# Patient Record
Sex: Female | Born: 2000 | Race: Black or African American | Hispanic: No | Marital: Single | Smoking: Never smoker
Health system: Southern US, Community
[De-identification: ages and names within clinical notes are randomized; demographics above are authoritative.]

## PROBLEM LIST (undated history)

## (undated) ENCOUNTER — Inpatient Hospital Stay (HOSPITAL_COMMUNITY): Payer: Self-pay

## (undated) DIAGNOSIS — E559 Vitamin D deficiency, unspecified: Secondary | ICD-10-CM

## (undated) DIAGNOSIS — Z789 Other specified health status: Secondary | ICD-10-CM

## (undated) DIAGNOSIS — G4700 Insomnia, unspecified: Secondary | ICD-10-CM

## (undated) DIAGNOSIS — K219 Gastro-esophageal reflux disease without esophagitis: Secondary | ICD-10-CM

## (undated) DIAGNOSIS — E785 Hyperlipidemia, unspecified: Secondary | ICD-10-CM

## (undated) DIAGNOSIS — F419 Anxiety disorder, unspecified: Secondary | ICD-10-CM

## (undated) DIAGNOSIS — F32A Depression, unspecified: Secondary | ICD-10-CM

## (undated) DIAGNOSIS — R7303 Prediabetes: Secondary | ICD-10-CM

## (undated) DIAGNOSIS — E669 Obesity, unspecified: Secondary | ICD-10-CM

## (undated) HISTORY — DX: Obesity, unspecified: E66.9

## (undated) HISTORY — DX: Vitamin D deficiency, unspecified: E55.9

## (undated) HISTORY — DX: Insomnia, unspecified: G47.00

## (undated) HISTORY — DX: Prediabetes: R73.03

## (undated) HISTORY — DX: Anxiety disorder, unspecified: F41.9

## (undated) HISTORY — PX: NO PAST SURGERIES: SHX2092

## (undated) HISTORY — DX: Hyperlipidemia, unspecified: E78.5

## (undated) HISTORY — DX: Other specified health status: Z78.9

## (undated) HISTORY — DX: Gastro-esophageal reflux disease without esophagitis: K21.9

## (undated) HISTORY — DX: Depression, unspecified: F32.A

---
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
## 2019-05-06 ENCOUNTER — Encounter (HOSPITAL_COMMUNITY): Payer: Self-pay | Admitting: *Deleted

## 2019-05-06 ENCOUNTER — Inpatient Hospital Stay (HOSPITAL_COMMUNITY)
Admission: AD | Admit: 2019-05-06 | Discharge: 2019-05-06 | Disposition: A | Discharge status: Home / Self Care | Payer: Medicaid Other | Attending: Obstetrics and Gynecology | Admitting: Obstetrics and Gynecology

## 2019-05-06 DIAGNOSIS — O209 Hemorrhage in early pregnancy, unspecified: Secondary | ICD-10-CM | POA: Insufficient documentation

## 2019-05-06 DIAGNOSIS — R109 Unspecified abdominal pain: Secondary | ICD-10-CM | POA: Diagnosis not present

## 2019-05-06 DIAGNOSIS — O26892 Other specified pregnancy related conditions, second trimester: Secondary | ICD-10-CM | POA: Diagnosis present

## 2019-05-06 DIAGNOSIS — Z3A15 15 weeks gestation of pregnancy: Secondary | ICD-10-CM | POA: Diagnosis not present

## 2019-05-06 LAB — URINALYSIS, ROUTINE W REFLEX MICROSCOPIC
Bilirubin Urine: NEGATIVE
Glucose, UA: NEGATIVE mg/dL
Ketones, ur: 20 mg/dL — AB
Nitrite: NEGATIVE
Protein, ur: NEGATIVE mg/dL
Specific Gravity, Urine: 1.031 — ABNORMAL HIGH (ref 1.005–1.030)
pH: 5 (ref 5.0–8.0)

## 2019-05-06 LAB — WET PREP, GENITAL
Clue Cells Wet Prep HPF POC: NONE SEEN
Sperm: NONE SEEN
Trich, Wet Prep: NONE SEEN
Yeast Wet Prep HPF POC: NONE SEEN

## 2019-05-06 LAB — ABO/RH: ABO/RH(D): A POS

## 2019-05-06 NOTE — MAU Note (Signed)
Patient presents via EMS for abdominal pain that started last night after a small dog (10-15lbs) jumped on her abdomen.  Rates pain 5/10 with constant pain in her lower abdomen.  Had 1 episode of vaginal spotting noted today and decided to get checked out.

## 2019-05-06 NOTE — Discharge Instructions (Signed)
[facility]   [facility]       Phone: [phone]  [facility] Phone: [phone]  [facility] [place]  Phone: [phone]  [facility] [place] [facility]  Phone: [phone]  [facility]  Phone: [phone]  [facility]       Phone: [phone]  [facility]    Phone: [phone]   [facility]    Phone: [phone]  [facility]    Phone: [phone]  [facility]    Phone: [phone]   [facility]  Phone: [phone]  [facility]    Phone: [phone]  [facility]   Phone: [phone]  [facility]    Phone: [phone]   Abdominal Pain During Pregnancy  Abdominal pain is common during pregnancy, and has many possible causes. Some causes are more serious than others, and sometimes the cause is not known. Abdominal pain can be a sign that labor is starting. It can also be caused by normal growth and stretching of muscles and ligaments during pregnancy. Always tell your health care provider if you have any abdominal pain. Follow these instructions at home:  Do not have sex or put anything in your vagina until your pain goes away completely.  Get plenty of rest until your pain improves.  Drink enough fluid to keep your urine pale yellow.  Take over-the-counter and prescription medicines only as told by your health care provider.  Keep all follow-up visits as told by your health care provider. This is important. Contact a health care provider if:  Your pain continues or gets worse after resting.  You have lower abdominal pain that: ? Comes and goes at regular intervals. ? Spreads to your  back. ? Is similar to menstrual cramps.  You have pain or burning when you urinate. Get help right away if:  You have a fever or chills.  You have vaginal bleeding.  You are leaking fluid from your vagina.  You are passing tissue from your vagina.  You have vomiting or diarrhea that lasts for more than 24 hours.  Your baby is moving less than usual.  You feel very weak or faint.  You have shortness of breath.  You develop severe pain in your upper abdomen. Summary  Abdominal pain is common during pregnancy, and has many possible causes.  If you experience abdominal pain during pregnancy, tell your health care provider right away.  Follow your health care provider's home care instructions and keep all follow-up visits as directed. This information is not intended to replace advice given to you by your health care provider. Make sure you discuss any questions you have with your health care provider. Document Released: 06/12/2005 Document Revised: 09/30/2018 Document Reviewed: 09/14/2016 Elsevier Patient Education  2020 Elsevier Inc.  

## 2019-05-06 NOTE — MAU Provider Note (Signed)
Chief Complaint: Abdominal Pain and Vaginal Bleeding   First Provider Initiated Contact with Patient 05/06/19 1207     SUBJECTIVE HPI: Colon Flatterynchanted C Heidi [name] is a 18 y.o. [name] at 2241w1d who presents to Maternity Admissions via EMS reporting abdominal pain & vaginal spotting. Symptoms started this morning. Patient worried because her 15 lb dog jumped on her abdomen last night.  Reports intermittent lower abdomen/suprapubic cramping. Dark red/brown spotting on toilet paper x 1 episode this morning. Denies dysuria, n/v/d, or vaginal discharge. No recent intercourse. Has not started prenatal care yet. [facility].   Location: abdomen Quality: cramping Severity: 5/10 on pain scale Duration: since this morning Timing: intermittent Modifying factors: none Associated signs and symptoms: vaginal spotting  History reviewed. No pertinent past medical history. OB History  Gravida Para Term Preterm AB Living  1            SAB TAB Ectopic Multiple Live Births               # Outcome Date GA Lbr Len/2nd Weight Sex Delivery Anes PTL Lv  1 Current            History reviewed. No pertinent surgical history. Social History   Socioeconomic History  . Marital status: Single    Spouse name: Not on file  . Number of children: Not on file  . Years of education: Not on file  . Highest education level: Not on file  Occupational History  . Not on file  Social Needs  . Financial resource strain: Not on file  . Food insecurity    Worry: Not on file    Inability: Not on file  . Transportation needs    Medical: Not on file    Non-medical: Not on file  Tobacco Use  . Smoking status: Never Smoker  Substance and Sexual Activity  . Alcohol use: No  . Drug use: No  . Sexual activity: Not Currently  Lifestyle  . Physical activity    Days per week: Not on file    Minutes per session: Not on file  . Stress: Not on file  Relationships  . Social  Musicianconnections    Talks on phone: Not on file    Gets together: Not on file    Attends religious service: Not on file    Active member of club or organization: Not on file    Attends meetings of clubs or organizations: Not on file    Relationship status: Not on file  . Intimate partner violence    Fear of current or ex partner: Not on file    Emotionally abused: Not on file    Physically abused: Not on file    Forced sexual activity: Not on file  Other Topics Concern  . Not on file  Social History Narrative  . Not on file   History reviewed. No pertinent family history. No current facility-administered medications on file prior to encounter.    No current outpatient medications on file prior to encounter.   Allergies  Allergen Reactions  . Strawberry Extract     I have reviewed patient's Past Medical Hx, Surgical Hx, Family Hx, Social Hx, medications and allergies.   Review of Systems  Constitutional: Negative.   Gastrointestinal: Positive for abdominal pain. Negative for diarrhea, nausea and vomiting.  Genitourinary: Positive for vaginal bleeding. Negative for dysuria and vaginal discharge.    OBJECTIVE Patient Vitals for the past 24 hrs:  BP Temp Temp src Pulse Resp SpO2  05/06/19 1401 (!) 107/57 98.8 F (37.1 C) Oral 100 17 100 %  05/06/19 1145 124/74 98.8 F (37.1 C) Oral 97 16 100 %   Constitutional: Well-developed, well-nourished female in no acute distress.  Cardiovascular: normal rate & rhythm, no murmur Respiratory: normal rate and effort. Lung sounds clear throughout GI: Abd soft, non-tender, Pos BS x 4. No guarding or rebound tenderness MS: Extremities nontender, no edema, normal ROM Neurologic: Alert and oriented x 4.  GU:     SPECULUM EXAM: NEFG, physiologic discharge, no blood noted, cervix clean  BIMANUAL: No CMT. cervix closed; uterus enlarged 15 wks size, no adnexal tenderness or masses.    LAB RESULTS Results for orders placed or performed during  the hospital encounter of 05/06/19 (from the past 24 hour(s))  Urinalysis, Routine w reflex microscopic     Status: Abnormal   Collection Time: 05/06/19 12:27 PM  Result Value Ref Range   Color, Urine YELLOW YELLOW   APPearance HAZY (A) CLEAR   Specific Gravity, Urine 1.031 (H) 1.005 - 1.030   pH 5.0 5.0 - 8.0   Glucose, UA NEGATIVE NEGATIVE mg/dL   Hgb urine dipstick MODERATE (A) NEGATIVE   Bilirubin Urine NEGATIVE NEGATIVE   Ketones, ur 20 (A) NEGATIVE mg/dL   Protein, ur NEGATIVE NEGATIVE mg/dL   Nitrite NEGATIVE NEGATIVE   Leukocytes,Ua SMALL (A) NEGATIVE   RBC / HPF 6-10 0 - 5 RBC/hpf   WBC, UA 0-5 0 - 5 WBC/hpf   Bacteria, UA FEW (A) NONE SEEN   Squamous Epithelial / LPF 0-5 0 - 5   Mucus PRESENT   Wet prep, genital     Status: Abnormal   Collection Time: 05/06/19 12:27 PM   Specimen: Cervix  Result Value Ref Range   Yeast Wet Prep HPF POC NONE SEEN NONE SEEN   Trich, Wet Prep NONE SEEN NONE SEEN   Clue Cells Wet Prep HPF POC NONE SEEN NONE SEEN   WBC, Wet Prep HPF POC MODERATE (A) NONE SEEN   Sperm NONE SEEN   ABO/Rh     Status: None   Collection Time: 05/06/19 12:28 PM  Result Value Ref Range   ABO/RH(D) A POS    No rh immune globuloin      NOT A RH IMMUNE GLOBULIN CANDIDATE, PT RH POSITIVE Performed at [facility], [address] [place], [place] [address]     IMAGING No results found.  MAU COURSE Orders Placed This Encounter  Procedures  . Wet prep, genital  . Culture, OB Urine  . Urinalysis, Routine w reflex microscopic  . ABO/Rh  . Discharge patient   No orders of the defined types were placed in this encounter.   MDM FHT present via doppler  ABO/RH collected RH positive  No blood on exam & cervix closed  Pt informed that the ultrasound is considered a limited OB ultrasound and is not intended to be a complete ultrasound exam.  Patient also informed that the ultrasound is not being completed with the intent of assessing for fetal or  placental anomalies or any pelvic abnormalities.  Explained that the purpose of today's ultrasound is to assess for  pt comfort.  Patient acknowledges the purpose of the exam and the limitations of the study.  Live IUP, ~[redacted]w[redacted]d by BPD & femur length. FHR 150s bpm   ASSESSMENT 1. Abdominal pain during pregnancy in second trimester   2. [redacted] weeks gestation of pregnancy  PLAN Discharge home in stable condition. SAB precautions GC/CT pending Urine culture pending Start prenatal care asap  Follow-up Information    [facility] Follow up.   Specialty: Obstetrics and Gynecology Why: return for worsening symptoms Contact information: [address] [phone] Wilhemina Bon[place] [address] [phone]         Allergies as of 05/06/2019      Reactions   Strawberry Extract       Medication List    You have not been prescribed any medications.      Judeth Horn, NP 05/06/2019  3:45 PM

## 2019-05-06 NOTE — MAU Note (Signed)
Discharge instructions reviewed, provider phone numbers given. Patient with no questions at this time.

## 2019-05-07 LAB — GC/CHLAMYDIA PROBE AMP (~~LOC~~) NOT AT ARMC
Chlamydia: NEGATIVE
Comment: NEGATIVE
Comment: NORMAL
Neisseria Gonorrhea: NEGATIVE

## 2019-05-08 LAB — CULTURE, OB URINE: Culture: >=100000 — AB

## 2019-05-09 ENCOUNTER — Telehealth: Payer: Self-pay | Admitting: Advanced Practice Midwife

## 2019-05-09 MED ORDER — CEPHALEXIN 500 MG PO CAPS: 500.0000 mg | ORAL_CAPSULE | Freq: Four times a day (QID) | ORAL | 0 refills | Status: DC

## 2019-05-09 NOTE — Telephone Encounter (Signed)
Called pt to notify her of UTI on UA 05/06/19 in MAU.  Keflex 500 mg QID x 7 days.  Pt states understanding. Pt to begin prenatal care as planned.

## 2019-06-30 ENCOUNTER — Other Ambulatory Visit: Payer: Self-pay

## 2019-06-30 ENCOUNTER — Ambulatory Visit (INDEPENDENT_AMBULATORY_CARE_PROVIDER_SITE_OTHER): Payer: Medicaid Other | Admitting: *Deleted

## 2019-06-30 DIAGNOSIS — Z3490 Encounter for supervision of normal pregnancy, unspecified, unspecified trimester: Secondary | ICD-10-CM

## 2019-06-30 DIAGNOSIS — E669 Obesity, unspecified: Secondary | ICD-10-CM | POA: Insufficient documentation

## 2019-06-30 MED ORDER — BLOOD PRESSURE KIT DEVI
1.0000 | 0 refills | Status: DC | PRN
Start: 2019-06-30 — End: 2022-06-23

## 2019-06-30 NOTE — Progress Notes (Signed)
I connected with  Colon Flattery on 06/30/19 at  2:30 PM EST by telephone and verified that I am speaking with the correct person using two identifiers.   I discussed the limitations, risks, security and privacy concerns of performing an evaluation and management service by telephone and the availability of in person appointments. I also discussed with the patient that there may be a patient responsible charge related to this service. The patient expressed understanding and agreed to proceed. Explained I am completing her New OB Intake today. We discussed Her EDD and that it is based on  US done at [facility]. She will bring her letter from them to her new ob visit tomorrow . I reviewed her allergies, meds, OB History, Medical /Surgical history, and appropriate screenings. I explained I will send her the [employer] app- app sent to her while on phone.  I explained we will send a blood pressure cuff to [facility] that will [facility] that prescription and they  will call her to verify her information. I asked her to bring the blood pressure cuff with her to her next  ob appointment so we can show her how to use it. Explained  then we will have her take her blood pressure weekly and enter into the app. Explained she will have some visits in office and some virtually. I sent her MyChart text and she signed up for MyChart and downloaded the MyChart  app. I reviewed her new ob  appointment date/ time with her , our location and to wear mask, no visitors.  I explained she will have a pelvic exam, ob bloodwork, hemoglobin a1C, cbg , genetic testing if desired,- she is not sure if she wants a panorama.. I scheduled an [place] for first available appointment and gave her the appointment. She voices understanding.   Matai Carpenito,RN 06/30/2019  2:27 PM

## 2019-06-30 NOTE — Patient Instructions (Signed)

## 2019-07-01 ENCOUNTER — Encounter: Payer: Self-pay | Admitting: Advanced Practice Midwife

## 2019-07-01 ENCOUNTER — Other Ambulatory Visit (HOSPITAL_COMMUNITY)
Admission: RE | Admit: 2019-07-01 | Discharge: 2019-07-01 | Disposition: A | Discharge status: Home / Self Care | Payer: Medicaid Other | Source: Ambulatory Visit | Attending: Advanced Practice Midwife | Admitting: Advanced Practice Midwife

## 2019-07-01 ENCOUNTER — Ambulatory Visit (INDEPENDENT_AMBULATORY_CARE_PROVIDER_SITE_OTHER): Payer: Medicaid Other | Admitting: Advanced Practice Midwife

## 2019-07-01 VITALS — BP 104/72 | HR 99 | Wt 172.7 lb

## 2019-07-01 DIAGNOSIS — Z3A23 23 weeks gestation of pregnancy: Secondary | ICD-10-CM

## 2019-07-01 DIAGNOSIS — Z3492 Encounter for supervision of normal pregnancy, unspecified, second trimester: Secondary | ICD-10-CM

## 2019-07-01 DIAGNOSIS — Z3490 Encounter for supervision of normal pregnancy, unspecified, unspecified trimester: Secondary | ICD-10-CM | POA: Diagnosis present

## 2019-07-01 NOTE — Progress Notes (Signed)
  Subjective:   Heidi [name] is a 19 y.o. [name] at 24w1dby early ultrasound being seen today for her first obstetrical visit.  Her obstetrical history is significant for NA. Patient does intend to breast feed. Pregnancy history fully reviewed.  [place] [facility] [place] and EDD of 10/27/19. Records reviewed, and will scan those in.   Patient reports no complaints.  HISTORY: OB History  Gravida Para Term Preterm AB Living  1 0 0 0 0 0  SAB TAB Ectopic Multiple Live Births  0 0 0 0 0    # Outcome Date GA Lbr Len/2nd Weight Sex Delivery Anes PTL Lv  1 Current             Last pap smear was done NA, age  and was NA, age   Past Medical History:  Diagnosis Date  . Medical history non-contributory    History reviewed. No pertinent surgical history. Family History  Problem Relation Age of Onset  . Diabetes Mother   . Hypertension Mother    Social History   Tobacco Use  . Smoking status: Never Smoker  . Smokeless tobacco: Never Used  Substance Use Topics  . Alcohol use: No  . Drug use: No   Allergies  Allergen Reactions  . Strawberry Extract    Current Outpatient Medications on File Prior to Visit  Medication Sig Dispense Refill  . Prenatal MV-Min-FA-Omega-3 (PRENATAL GUMMIES/DHA & FA PO) Take 2 tablets by mouth daily.    . Blood Pressure Monitoring (BLOOD PRESSURE KIT) DEVI 1 Device by Does not apply route as needed. (Patient not taking: Reported on 07/01/2019) 1 each 0   No current facility-administered medications on file prior to visit.    Review of Systems Pertinent items noted in HPI and remainder of comprehensive ROS otherwise negative.  Exam   Vitals:   07/01/19 0849  BP: 104/72  Pulse: 99  Weight: 172 lb 11.2 oz (78.3 kg)   Fetal Heart Rate (bpm): 136  Physical Exam  Constitutional: She is oriented to person, place, and time and well-developed, well-nourished, and in no distress. No distress.  Cardiovascular: Normal rate.   Pulmonary/Chest: Effort normal.  Abdominal: Soft. There is no abdominal tenderness.  Neurological: She is alert and oriented to person, place, and time.  Skin: Skin is warm and dry.  Psychiatric: Affect normal.  Nursing note and vitals reviewed.  23 cm fundal height   Assessment:   Pregnancy: [name] Patient Active Problem List   Diagnosis Date Noted  . Supervision of low-risk pregnancy 06/30/2019  . Obesity (BMI 30-39.9) 06/30/2019     Plan:  1. Encounter for supervision of low-risk pregnancy, antepartum - routine care   Initial labs drawn. Continue prenatal vitamins. Genetic Screening discussed, AFP and NIPS: requested. Ultrasound discussed; fetal anatomic survey: ordered. Problem list reviewed and updated. The nature of [place] and other [facility] Practice Providers was explained to patient; also emphasized that residents, students are part of our team. Routine obstetric precautions reviewed. 50% of 45 min visit spent in counseling and coordination of care. No follow-ups on file.   HMarcille BuffyDNP, CNM  07/01/19  9:13 AM

## 2019-07-01 NOTE — Progress Notes (Signed)
Pt reports occasional back pain and pelvic pressure.

## 2019-07-02 ENCOUNTER — Encounter: Payer: Self-pay | Admitting: *Deleted

## 2019-07-02 LAB — OBSTETRIC PANEL, INCLUDING HIV
Antibody Screen: NEGATIVE
Basophils Absolute: 0 10*3/uL (ref 0.0–0.2)
Basos: 0 %
EOS (ABSOLUTE): 0 10*3/uL (ref 0.0–0.4)
Eos: 0 %
HIV Screen 4th Generation wRfx: NONREACTIVE
Hematocrit: 29.6 % — ABNORMAL LOW (ref 34.0–46.6)
Hemoglobin: 9.8 g/dL — ABNORMAL LOW (ref 11.1–15.9)
Hepatitis B Surface Ag: NEGATIVE
Immature Grans (Abs): 0.1 10*3/uL (ref 0.0–0.1)
Immature Granulocytes: 1 %
Lymphocytes Absolute: 0.9 10*3/uL (ref 0.7–3.1)
Lymphs: 19 %
MCH: 29.2 pg (ref 26.6–33.0)
MCHC: 33.1 g/dL (ref 31.5–35.7)
MCV: 88 fL (ref 79–97)
Monocytes Absolute: 0.9 10*3/uL (ref 0.1–0.9)
Monocytes: 19 %
Neutrophils Absolute: 3 10*3/uL (ref 1.4–7.0)
Neutrophils: 61 %
Platelets: 99 10*3/uL — CL (ref 150–450)
RBC: 3.36 x10E6/uL — ABNORMAL LOW (ref 3.77–5.28)
RDW: 13.2 % (ref 11.7–15.4)
RPR Ser Ql: NONREACTIVE
Rh Factor: POSITIVE
Rubella Antibodies, IGG: 3.09 index (ref >0.99)
WBC: 4.9 10*3/uL (ref 3.4–10.8)

## 2019-07-02 LAB — HEMOGLOBIN A1C
Est. average glucose Bld gHb Est-mCnc: 91 mg/dL
Hgb A1c MFr Bld: 4.8 % (ref 4.8–5.6)

## 2019-07-02 LAB — GC/CHLAMYDIA PROBE AMP (~~LOC~~) NOT AT ARMC
Chlamydia: NEGATIVE
Comment: NEGATIVE
Comment: NORMAL
Neisseria Gonorrhea: NEGATIVE

## 2019-07-02 MED ORDER — DOCUSATE SODIUM 50 MG PO CAPS: 50.0000 mg | ORAL_CAPSULE | Freq: Every day | ORAL | 3 refills | Status: DC | PRN

## 2019-07-02 MED ORDER — FERROUS GLUCONATE 324 (38 FE) MG PO TABS: 324.0000 mg | ORAL_TABLET | Freq: Every day | ORAL | 3 refills | Status: DC

## 2019-07-02 NOTE — Addendum Note (Signed)
Addended by: Thressa Sheller D on: 07/02/2019 10:47 AM   Modules accepted: Orders

## 2019-07-03 LAB — AFP, SERUM, OPEN SPINA BIFIDA
AFP MoM: 1.12
AFP Value: 97.6 ng/mL
Gest. Age on Collection Date: 23.1 weeks
Maternal Age At EDD: 19.6 yr
OSBR Risk 1 IN: 10000
Test Results:: NEGATIVE
Weight: 172 [lb_av]

## 2019-07-04 LAB — CULTURE, OB URINE

## 2019-07-04 LAB — URINE CULTURE, OB REFLEX

## 2019-07-08 MED ORDER — NITROFURANTOIN MONOHYD MACRO 100 MG PO CAPS: 100.0000 mg | ORAL_CAPSULE | Freq: Two times a day (BID) | ORAL | 0 refills | Status: DC

## 2019-07-08 NOTE — Addendum Note (Signed)
Addended by: Thressa Sheller D on: 07/08/2019 09:24 AM   Modules accepted: Orders

## 2019-07-09 ENCOUNTER — Encounter: Payer: Self-pay | Admitting: *Deleted

## 2019-07-14 ENCOUNTER — Encounter: Payer: Self-pay | Admitting: *Deleted

## 2019-07-17 ENCOUNTER — Encounter (HOSPITAL_COMMUNITY): Payer: Self-pay

## 2019-07-17 ENCOUNTER — Other Ambulatory Visit: Payer: Self-pay

## 2019-07-17 ENCOUNTER — Ambulatory Visit (HOSPITAL_COMMUNITY): Payer: Medicaid Other | Admitting: *Deleted

## 2019-07-17 ENCOUNTER — Ambulatory Visit (HOSPITAL_COMMUNITY)
Admission: RE | Admit: 2019-07-17 | Discharge: 2019-07-17 | Disposition: A | Discharge status: Home / Self Care | Payer: Medicaid Other | Source: Ambulatory Visit | Attending: Obstetrics and Gynecology | Admitting: Obstetrics and Gynecology

## 2019-07-17 ENCOUNTER — Other Ambulatory Visit (HOSPITAL_COMMUNITY): Payer: Self-pay | Admitting: *Deleted

## 2019-07-17 DIAGNOSIS — Z3490 Encounter for supervision of normal pregnancy, unspecified, unspecified trimester: Secondary | ICD-10-CM | POA: Diagnosis not present

## 2019-07-17 DIAGNOSIS — E669 Obesity, unspecified: Secondary | ICD-10-CM | POA: Insufficient documentation

## 2019-07-17 DIAGNOSIS — O99212 Obesity complicating pregnancy, second trimester: Secondary | ICD-10-CM | POA: Diagnosis not present

## 2019-07-17 DIAGNOSIS — Z3A25 25 weeks gestation of pregnancy: Secondary | ICD-10-CM | POA: Diagnosis not present

## 2019-07-17 DIAGNOSIS — O99213 Obesity complicating pregnancy, third trimester: Secondary | ICD-10-CM

## 2019-07-21 ENCOUNTER — Encounter: Payer: Self-pay | Admitting: *Deleted

## 2019-07-22 ENCOUNTER — Telehealth (INDEPENDENT_AMBULATORY_CARE_PROVIDER_SITE_OTHER): Payer: Medicaid Other | Admitting: Lactation Services

## 2019-07-22 DIAGNOSIS — Z3493 Encounter for supervision of normal pregnancy, unspecified, third trimester: Secondary | ICD-10-CM

## 2019-07-22 NOTE — Telephone Encounter (Signed)
Called pt to let her know the results of her Genetic Screening. 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 Advised pt that it is recommended that she call [facility] at [phone] to set up a Genetic Counseling session and possibly have the FOB tested for the gene. Pt voiced understanding.

## 2019-07-23 ENCOUNTER — Other Ambulatory Visit: Payer: Self-pay | Admitting: Lactation Services

## 2019-07-23 DIAGNOSIS — Z3493 Encounter for supervision of normal pregnancy, unspecified, third trimester: Secondary | ICD-10-CM

## 2019-07-27 ENCOUNTER — Encounter: Payer: Self-pay | Admitting: Advanced Practice Midwife

## 2019-07-29 ENCOUNTER — Other Ambulatory Visit: Payer: Medicaid Other

## 2019-07-29 ENCOUNTER — Other Ambulatory Visit: Payer: Self-pay

## 2019-07-29 ENCOUNTER — Ambulatory Visit (INDEPENDENT_AMBULATORY_CARE_PROVIDER_SITE_OTHER): Payer: Medicaid Other | Admitting: Student

## 2019-07-29 DIAGNOSIS — Z23 Encounter for immunization: Secondary | ICD-10-CM

## 2019-07-29 DIAGNOSIS — Z3493 Encounter for supervision of normal pregnancy, unspecified, third trimester: Secondary | ICD-10-CM

## 2019-07-29 DIAGNOSIS — Z3492 Encounter for supervision of normal pregnancy, unspecified, second trimester: Secondary | ICD-10-CM | POA: Diagnosis present

## 2019-07-29 DIAGNOSIS — Z3A27 27 weeks gestation of pregnancy: Secondary | ICD-10-CM

## 2019-07-29 NOTE — Progress Notes (Signed)
   PRENATAL VISIT NOTE  Subjective:  Heidi [name] is a 19 y.o. [name] at [redacted]w[redacted]d being seen today for ongoing prenatal care.  She is currently monitored for the following issues for this low-risk pregnancy and has Supervision of low-risk pregnancy and Obesity (BMI 30-39.9) on their problem list.  Patient reports no complaints. She is taing her blood pressure and entering it in baby RX.  Contractions: Not present. Vag. Bleeding: None.  Movement: Present. Denies leaking of fluid.   The following portions of the patient's history were reviewed and updated as appropriate: allergies, current medications, past family history, past medical history, past social history, past surgical history and problem list.   Objective:   Vitals:   07/29/19 0822  BP: (!) 101/50  Pulse: 66  Weight: 178 lb 12.8 oz (81.1 kg)    Fetal Status: Fetal Heart Rate (bpm): 135 Fundal Height: 28 cm Movement: Present     General:  Alert, oriented and cooperative. Patient is in no acute distress.  Skin: Skin is warm and dry. No rash noted.   Cardiovascular: Normal heart rate noted  Respiratory: Normal respiratory effort, no problems with respiration noted  Abdomen: Soft, gravid, appropriate for gestational age.  Pain/Pressure: Absent     Pelvic: Cervical exam deferred        Extremities: Normal range of motion.  Edema: None  Mental Status: Normal mood and affect. Normal behavior. Normal judgment and thought content.   Assessment and Plan:  Pregnancy: [name] at [redacted]w[redacted]d 1. Encounter for supervision of low-risk pregnancy in third trimester - Tdap vaccine greater than or equal to 7yo IM -Patient doing well Preterm labor symptoms and general obstetric precautions including but not limited to vaginal bleeding, contractions, leaking of fluid and fetal movement were reviewed in detail with the patient. Please refer to After Visit Summary for other counseling recommendations.   No follow-ups on file.  Future Appointments    Date Time Provider Department Center  07/29/2019  8:50 AM [facility] LAB [facility]A WOC  08/14/2019  1:00 PM [facility]-M[facility] NURSE [facility]FC MFC-US  08/14/2019  1:15 PM [facility]FC [place] 4 WH-MFCUS MFC-US    Marylene Land, CNM

## 2019-07-30 LAB — CBC
Hematocrit: 29.1 % — ABNORMAL LOW (ref 34.0–46.6)
Hemoglobin: 10.1 g/dL — ABNORMAL LOW (ref 11.1–15.9)
MCH: 30.4 pg (ref 26.6–33.0)
MCHC: 34.7 g/dL (ref 31.5–35.7)
MCV: 88 fL (ref 79–97)
Platelets: 98 10*3/uL — CL (ref 150–450)
RBC: 3.32 x10E6/uL — ABNORMAL LOW (ref 3.77–5.28)
RDW: 12.8 % (ref 11.7–15.4)
WBC: 7.5 10*3/uL (ref 3.4–10.8)

## 2019-07-30 LAB — GLUCOSE TOLERANCE, 2 HOURS W/ 1HR
Glucose, 1 hour: 127 mg/dL (ref 65–179)
Glucose, 2 hour: 115 mg/dL (ref 65–152)
Glucose, Fasting: 76 mg/dL (ref 65–91)

## 2019-07-30 LAB — RPR: RPR Ser Ql: NONREACTIVE

## 2019-07-30 LAB — HIV ANTIBODY (ROUTINE TESTING W REFLEX): HIV Screen 4th Generation wRfx: NONREACTIVE

## 2019-08-02 ENCOUNTER — Other Ambulatory Visit: Payer: Self-pay | Admitting: Student

## 2019-08-02 DIAGNOSIS — D696 Thrombocytopenia, unspecified: Secondary | ICD-10-CM | POA: Insufficient documentation

## 2019-08-04 ENCOUNTER — Telehealth: Payer: Self-pay | Admitting: Licensed Clinical Social Worker

## 2019-08-04 ENCOUNTER — Telehealth (INDEPENDENT_AMBULATORY_CARE_PROVIDER_SITE_OTHER): Payer: Medicaid Other | Admitting: Lactation Services

## 2019-08-04 ENCOUNTER — Telehealth: Payer: Self-pay | Admitting: Hematology and Oncology

## 2019-08-04 DIAGNOSIS — D696 Thrombocytopenia, unspecified: Secondary | ICD-10-CM

## 2019-08-04 NOTE — Telephone Encounter (Signed)
Appt scheduled with Hematology on 08/15/2019 at 0900 at [facility] with Dr. Leonides Schanz.   Called pt to inform her of her appt and location. Pt voiced understanding.

## 2019-08-04 NOTE — Telephone Encounter (Signed)
Received a new hem referral from [facility] for thrombocytopenia in pregnancy. MS. Ingman has been scheduled to see Dr. Leonides Schanz on 2/19 at [facility] from the referring office will notify the pt.

## 2019-08-04 NOTE — Telephone Encounter (Signed)
-----   Message from Marylene Land, CNM sent at 08/02/2019 12:05 PM EST ----- Regarding: patient needs hematology referral Hello! This patient has low platelets; she needs a referral to hematology. I will put the order in and let her know that you will be contacting her. Thank you so much!  Luna Kitchens

## 2019-08-04 NOTE — Telephone Encounter (Signed)
Subjective: Heidi [name] is a [name] at [redacted]w[redacted]d completed contraception counseling via telehealth.  She does not have a history of any mental health concerns. She is not currently sexually active. She is currently using no method  for birth control. She has had recent STD screening on 1/5/202. Patient states father of baby and family as her support system.    Birth Control History:  None   MDM Patient counseled on all options for birth control today including LARC. Patient desires nexplanoninitiated for birth control.  Assessment:  19 y.o. female desires nexplanon for birth control  Plan: No further plan   Heidi [name], Heidi [name] 08/04/2019 1:42 PM

## 2019-08-14 ENCOUNTER — Ambulatory Visit (HOSPITAL_COMMUNITY): Payer: Medicaid Other

## 2019-08-14 ENCOUNTER — Ambulatory Visit (HOSPITAL_COMMUNITY): Admission: RE | Admit: 2019-08-14 | Payer: Medicaid Other | Source: Ambulatory Visit

## 2019-08-14 ENCOUNTER — Telehealth: Payer: Self-pay | Admitting: Hematology and Oncology

## 2019-08-14 NOTE — Telephone Encounter (Signed)
Per provider pt is aware of new appt date and time

## 2019-08-15 ENCOUNTER — Inpatient Hospital Stay: Payer: Medicaid Other | Admitting: Hematology and Oncology

## 2019-08-15 ENCOUNTER — Inpatient Hospital Stay: Payer: Medicaid Other

## 2019-08-22 ENCOUNTER — Other Ambulatory Visit: Payer: Self-pay

## 2019-08-22 ENCOUNTER — Inpatient Hospital Stay: Payer: Medicaid Other | Attending: Hematology and Oncology | Admitting: Hematology and Oncology

## 2019-08-22 ENCOUNTER — Encounter: Payer: Self-pay | Admitting: Hematology and Oncology

## 2019-08-22 ENCOUNTER — Encounter: Payer: Self-pay | Admitting: Advanced Practice Midwife

## 2019-08-22 ENCOUNTER — Inpatient Hospital Stay: Payer: Medicaid Other

## 2019-08-22 ENCOUNTER — Other Ambulatory Visit: Payer: Medicaid Other

## 2019-08-22 VITALS — BP 109/64 | HR 82 | Temp 98.3°F | Resp 18 | Ht 60.0 in | Wt 179.9 lb

## 2019-08-22 DIAGNOSIS — R12 Heartburn: Secondary | ICD-10-CM | POA: Diagnosis not present

## 2019-08-22 DIAGNOSIS — D696 Thrombocytopenia, unspecified: Secondary | ICD-10-CM | POA: Diagnosis not present

## 2019-08-22 DIAGNOSIS — O99113 Other diseases of the blood and blood-forming organs and certain disorders involving the immune mechanism complicating pregnancy, third trimester: Secondary | ICD-10-CM

## 2019-08-22 DIAGNOSIS — Z3A30 30 weeks gestation of pregnancy: Secondary | ICD-10-CM | POA: Insufficient documentation

## 2019-08-22 LAB — CMP (CANCER CENTER ONLY)
ALT: 7 U/L (ref 0–44)
AST: 12 U/L — ABNORMAL LOW (ref 15–41)
Albumin: 2.9 g/dL — ABNORMAL LOW (ref 3.5–5.0)
Alkaline Phosphatase: 75 U/L (ref 38–126)
Anion gap: 8 (ref 5–15)
BUN: 8 mg/dL (ref 6–20)
CO2: 22 mmol/L (ref 22–32)
Calcium: 8.6 mg/dL — ABNORMAL LOW (ref 8.9–10.3)
Chloride: 107 mmol/L (ref 98–111)
Creatinine: 0.55 mg/dL (ref 0.44–1.00)
GFR, Est AFR Am: >60 mL/min (ref >60)
GFR, Estimated: >60 mL/min (ref >60)
Glucose, Bld: 103 mg/dL — ABNORMAL HIGH (ref 70–99)
Potassium: 3.7 mmol/L (ref 3.5–5.1)
Sodium: 137 mmol/L (ref 135–145)
Total Bilirubin: <0.2 mg/dL — ABNORMAL LOW (ref 0.3–1.2)
Total Protein: 6.7 g/dL (ref 6.5–8.1)

## 2019-08-22 LAB — CBC WITH DIFFERENTIAL (CANCER CENTER ONLY)
Abs Immature Granulocytes: 0.06 10*3/uL (ref 0.00–0.07)
Basophils Absolute: 0 10*3/uL (ref 0.0–0.1)
Basophils Relative: 0 %
Eosinophils Absolute: 0.1 10*3/uL (ref 0.0–0.5)
Eosinophils Relative: 1 %
HCT: 29.1 % — ABNORMAL LOW (ref 36.0–46.0)
Hemoglobin: 9.9 g/dL — ABNORMAL LOW (ref 12.0–15.0)
Immature Granulocytes: 1 %
Lymphocytes Relative: 23 %
Lymphs Abs: 1.5 10*3/uL (ref 0.7–4.0)
MCH: 29.3 pg (ref 26.0–34.0)
MCHC: 34 g/dL (ref 30.0–36.0)
MCV: 86.1 fL (ref 80.0–100.0)
Monocytes Absolute: 0.7 10*3/uL (ref 0.1–1.0)
Monocytes Relative: 11 %
Neutro Abs: 4.2 10*3/uL (ref 1.7–7.7)
Neutrophils Relative %: 64 %
Platelet Count: 113 10*3/uL — ABNORMAL LOW (ref 150–400)
RBC: 3.38 MIL/uL — ABNORMAL LOW (ref 3.87–5.11)
RDW: 12.4 % (ref 11.5–15.5)
WBC Count: 6.6 10*3/uL (ref 4.0–10.5)
nRBC: 0 % (ref 0.0–0.2)

## 2019-08-22 LAB — SAVE SMEAR(SSMR), FOR PROVIDER SLIDE REVIEW

## 2019-08-22 LAB — IMMATURE PLATELET FRACTION: Immature Platelet Fraction: 13.5 % — ABNORMAL HIGH (ref 1.2–8.6)

## 2019-08-22 LAB — VITAMIN B12: Vitamin B-12: 217 pg/mL (ref 180–914)

## 2019-08-22 LAB — FOLATE: Folate: 13.8 ng/mL (ref >5.9)

## 2019-08-22 LAB — LACTATE DEHYDROGENASE: LDH: 136 U/L (ref 98–192)

## 2019-08-22 LAB — PLATELET BY CITRATE

## 2019-08-22 NOTE — Progress Notes (Signed)
[place] [phone]   [phone]  INITIAL CONSULT NOTE  Patient Care Team: Patient, No Pcp Per as PCP - General (General Practice)  Hematological/Oncological History #Thrombocytopenia in Pregnancy 1) 07/01/2019: WBC 4.9, Hgb 9.8, MCV 88, Plt 99 2) 07/29/2019: WBC 7.5, Hgb 10.1, MCV 88, Plt 98  CHIEF COMPLAINTS/PURPOSE OF CONSULTATION:  "Thrombocytopenia in Pregnancy "  HISTORY OF PRESENTING ILLNESS:  Heidi [name] 19 y.o. female with no significant past medical history presents for evaluation of thrombocytopenia in pregnancy.   On review of the previous records Heidi [name] has thrombocytopenia dating back to at least 07/01/2019.  Unfortunately in our system there are no records prior to this date.  She was pregnant at that time and her platelets were 99.  On most recent recheck on 07/29/2019 the patient was found to have a platelet count of 98.  Also of note the patient had a hemoglobin of 9.8 and MCV of 88 in January, with on most recent check a hemoglobin of 10.1 and MCV of 88.  She was started on p.o. iron supplementation by her OB/GYN physicians.  In order to further investigate this thrombocytopenia the patient was referred to hematology for further evaluation.   On exam today Heidi [name] notes that she feels well.  She reports that her pregnancy thus far has been uneventful.  She notes that she did have some previous morning sickness during her first term, however that has resolved.  [name] notes that her major symptom at this point time has been heartburn and that she is not taking any therapy for it because she attempted Tums without any relief.  She does endorse that she is taking iron pills but that they are not causing her any constipation or abdominal discomfort.  She notes that she began these approximately 3 months into her pregnancy.  She is [redacted] weeks pregnant at this time with a planned delivery date of Oct 27, 2019.  On further discussion she  notes that she had normal menstrual cycles prior to becoming pregnant.  She notes they were regular and occurred every 28 days and lasted for 7 days.  She notes that they were heaviest on the first 2 to 3 days and this would go through 2-3 pads per day and that they were not saturated.  She reports that he eats she eats a normal unrestricted diet consisting of meats such as red meat, pork, poultry, and vegetables.  She has no family history remarkable for hematological disorders.  She also notes that she is not having any other sources of bleeding and has never had issues with bleeding before in the past.  A full 10 point ROS was otherwise negative.  MEDICAL HISTORY:  Past Medical History:  Diagnosis Date  . Medical history non-contributory     SURGICAL HISTORY: Past Surgical History:  Procedure Laterality Date  . NO PAST SURGERIES      SOCIAL HISTORY: Social History   Socioeconomic History  . Marital status: Single    Spouse name: Not on file  . Number of children: Not on file  . Years of education: Not on file  . Highest education level: Not on file  Occupational History  . Not on file  Tobacco Use  . Smoking status: Never Smoker  . Smokeless tobacco: Never Used  Substance and Sexual Activity  . Alcohol use: No  . Drug use: No  . Sexual activity: Yes    Birth control/protection: None  Other Topics Concern  .  Not on file  Social History Narrative  . Not on file   Social Determinants of Health   Financial Resource Strain:   . Difficulty of Paying Living Expenses: Not on file  Food Insecurity: No Food Insecurity  . Worried About Charity fundraiser in the Last Year: Never true  . Ran Out of Food in the Last Year: Never true  Transportation Needs: No Transportation Needs  . Lack of Transportation (Medical): No  . Lack of Transportation (Non-Medical): No  Physical Activity:   . Days of Exercise per Week: Not on file  . Minutes of Exercise per Session: Not on file    Stress:   . Feeling of Stress : Not on file  Social Connections:   . Frequency of Communication with Friends and Family: Not on file  . Frequency of Social Gatherings with Friends and Family: Not on file  . Attends Religious Services: Not on file  . Active Member of Clubs or Organizations: Not on file  . Attends Archivist Meetings: Not on file  . Marital Status: Not on file  Intimate Partner Violence: Not At Risk  . Fear of Current or Ex-Partner: No  . Emotionally Abused: No  . Physically Abused: No  . Sexually Abused: No    FAMILY HISTORY: Family History  Problem Relation Age of Onset  . Diabetes Mother   . Hypertension Mother     ALLERGIES:  is allergic to strawberry extract.  MEDICATIONS:  Current Outpatient Medications  Medication Sig Dispense Refill  . Blood Pressure Monitoring (BLOOD PRESSURE KIT) DEVI 1 Device by Does not apply route as needed. 1 each 0  . docusate sodium (COLACE) 50 MG capsule Take 1 capsule (50 mg total) by mouth daily as needed for mild constipation. 30 capsule 3  . ferrous gluconate (FERGON) 324 MG tablet Take 1 tablet (324 mg total) by mouth daily with breakfast. 30 tablet 3  . nitrofurantoin, macrocrystal-monohydrate, (MACROBID) 100 MG capsule Take 1 capsule (100 mg total) by mouth 2 (two) times daily. (Patient not taking: Reported on 07/17/2019) 14 capsule 0  . Prenatal MV-Min-FA-Omega-3 (PRENATAL GUMMIES/DHA & FA PO) Take 2 tablets by mouth daily.     No current facility-administered medications for this visit.    REVIEW OF SYSTEMS:   Constitutional: ( - ) fevers, ( - )  chills , ( - ) night sweats Eyes: ( - ) blurriness of vision, ( - ) double vision, ( - ) watery eyes Ears, nose, mouth, throat, and face: ( - ) mucositis, ( - ) sore throat Respiratory: ( - ) cough, ( - ) dyspnea, ( - ) wheezes Cardiovascular: ( - ) palpitation, ( - ) chest discomfort, ( - ) lower extremity swelling Gastrointestinal:  ( - ) nausea, ( + )  heartburn, ( - ) change in bowel habits Skin: ( - ) abnormal skin rashes Lymphatics: ( - ) new lymphadenopathy, ( - ) easy bruising Neurological: ( - ) numbness, ( - ) tingling, ( - ) new weaknesses Behavioral/Psych: ( - ) mood change, ( - ) new changes  All other systems were reviewed with the patient and are negative.  PHYSICAL EXAMINATION: ECOG PERFORMANCE STATUS: 0 - Asymptomatic  Vitals:   08/22/19 1409  BP: 109/64  Pulse: 82  Resp: 18  Temp: 98.3 F (36.8 C)  SpO2: 100%   Filed Weights   08/22/19 1409  Weight: 179 lb 14.4 oz (81.6 kg)    GENERAL: well appearing young  African American female in NAD  SKIN: skin color, texture, turgor are normal, no rashes or significant lesions EYES: conjunctiva are pink and non-injected, sclera clear LUNGS: clear to auscultation and percussion with normal breathing effort HEART: regular rate & rhythm and no murmurs and no lower extremity edema ABDOMEN: gravid uterus.  Musculoskeletal: no cyanosis of digits and no clubbing  PSYCH: alert & oriented x 3, fluent speech NEURO: no focal motor/sensory deficits  LABORATORY DATA:  I have reviewed the data as listed CBC Latest Ref Rng & Units 07/29/2019 07/01/2019  WBC 3.4 - 10.8 x10E3/uL 7.5 4.9  Hemoglobin 11.1 - 15.9 g/dL 10.1(L) 9.8(L)  Hematocrit 34.0 - 46.6 % 29.1(L) 29.6(L)  Platelets 150 - 450 x10E3/uL 98(LL) 99(LL)    No flowsheet data found.   PATHOLOGY: None relevant to review.   BLOOD FILM:  Review of the peripheral blood smear showed normal appearing white cells with neutrophils that were appropriately lobated and granulated. There was no predominance of bi-lobed or hyper-segmented neutrophils appreciated. No Dohle bodies were noted. There was no left shifting, immature forms or blasts noted. Lymphocytes remain normal in size without any predominance of large granular lymphocytes. Red cells show no anisopoikilocytosis, macrocytes , microcytes or polychromasia. There were no  schistocytes, target cells, echinocytes, acanthocytes, dacrocytes, or stomatocytes.There was no rouleaux formation, nucleated red cells, or intra-cellular inclusions noted. The platelets are normal in size, shape, and color without any clumping evident.  RADIOGRAPHIC STUDIES: None relevant to review.  No results found.  ASSESSMENT & PLAN DENICIA PAGLIARULO 19 y.o. female with no significant past medical history presents for evaluation of thrombocytopenia in pregnancy.  After review of the labs and discussion with the patient her findings are most consistent with a benign gestational thrombocytopenia of pregnancy.  Unfortunately at this time we do not have records predating her pregnancy and therefore we are unable to determine how long this thrombocytopenia exist.  That being said her platelet count is within range of what would be expected with benign gestational thrombocytopenia.  Otherwise she has no other concerning medical conditions that would make [place] think that there is an alternative etiology for this thrombocytopenia.  In order to be thorough we will order a peripheral blood film nutritional studies to assure no pseudothrombocytopenia or nutritional deficiencies.  In terms of management of thrombocytopenia as long as her platelet count remains above 70 K she should be able to receive an epidural and safely deliver.  If epidural is deferred a platelet count of 50,000 is recommended prior to delivery.  And otherwise asymptomatic patients during pregnancy intervention is not typically required unless platelets drop below 30,000.  At this time we intend to have Ms. Childress follow-up with [place] in April 2021 prior to the time of delivery to assure that her platelet count has remained stable.  Additionally we would like to see her back in 6 to 8 weeks postpartum to assure that her thrombocytopenia has resolved with the delivery of her baby boy.  #Thrombocytopenia in Pregnancy --most likely etiology for  this patient's thrombocytopenia is benign gestational thrombocytopenia ( J Prenat Med. 2011;5(4):90-92). --will r/o other etiologies with nutritional studies and peripheral blood film --in order for safe deliver, we recommend a platelet count of 50K. In order to undergo an epidural we recommend 70k ( Anesthesiology. 2017 Jun;126(6):1053-1063.). Otherwise well pregnant patient do not require intervention until plts reach 30k.  --recommend calling hematology team if platelets are found to be beneath 70k prior to delivery --we will  have Ms. Homeyer f/u with [place] in April 2021 prior to delivery to assure platelets are stable.  --we anticipate Plts will return to normal post delivery and will schedule f/u for a 6-8 week post partum visit (to be scheduled after next visit).   No orders of the defined types were placed in this encounter.   All questions were answered. The patient knows to call the clinic with any problems, questions or concerns.  A total of more than 60 minutes were spent on this encounter and over half of that time was spent on counseling and coordination of care as outlined above.   Ledell Peoples, MD Department of Hematology/Oncology [place] [facility] Phone: [phone] Pager: [phone] Email: [email or web address]  08/22/2019 2:15 PM   Literature Support:  1) Lexine Baton BT, [place], Klumpner TT, [place], [place] MF, Hand KW, Dorita Fray, Goodier CG, Lou Miner ME; Multicenter Perioperative Outcomes [employer] Investigators. Risk of Epidural Hematoma after Neuraxial Techniques in Thrombocytopenic Parturients: A Report from the Multicenter Perioperative Outcomes Group. Anesthesiology. 2017 Jun;126(6):1053-1063. doi: 10.1097/ALN.0000000000001630. PMID: 91980221; PMCID: TVG1025486.   --The upper bound of the 95% CI for the risk of epidural hematoma for a platelet count of 0 to 49,000 mm is 11%, for 50,000 to 69,000 mm is 3%, and for  70,000 to 100,000 mm is 0.2%.   2) [place], Antonella S, Albana C. Platelets in pregnancy. [place] 2011;5(4):90-92.   --Normal pregnancy is characterized by an increase in platelet aggregation and a decrease in the number of circulating platelets with gestation. Platelet lifespan declines and the MPV increases minimally during pregnancy

## 2019-08-25 ENCOUNTER — Telehealth: Payer: Self-pay | Admitting: Hematology and Oncology

## 2019-08-25 ENCOUNTER — Other Ambulatory Visit: Payer: Self-pay | Admitting: *Deleted

## 2019-08-25 ENCOUNTER — Encounter: Payer: Self-pay | Admitting: Advanced Practice Midwife

## 2019-08-25 DIAGNOSIS — Z3490 Encounter for supervision of normal pregnancy, unspecified, unspecified trimester: Secondary | ICD-10-CM

## 2019-08-25 LAB — IRON AND TIBC
Iron: 49 ug/dL (ref 41–142)
Saturation Ratios: 12 % — ABNORMAL LOW (ref 21–57)
TIBC: 418 ug/dL (ref 236–444)
UIBC: 369 ug/dL (ref 120–384)

## 2019-08-25 LAB — FERRITIN: Ferritin: 15 ng/mL (ref 11–307)

## 2019-08-25 MED ORDER — PRENATAL 27-0.8 MG PO TABS: 1.0000 | ORAL_TABLET | Freq: Every day | ORAL | 6 refills | Status: DC

## 2019-08-25 NOTE — Telephone Encounter (Signed)
Scheduled per 2/26 los. Called and spoke with patient. Confirmed appt  

## 2019-08-26 ENCOUNTER — Encounter: Payer: Medicaid Other | Admitting: Medical

## 2019-08-26 ENCOUNTER — Other Ambulatory Visit: Payer: Self-pay

## 2019-08-26 LAB — HOMOCYSTEINE: Homocysteine: 3.7 umol/L (ref 0.0–14.5)

## 2019-08-26 NOTE — Patient Instructions (Signed)
Please reschedule your missed appointment as soon as possible 

## 2019-08-26 NOTE — Progress Notes (Signed)
@  1105am no answer will call in 5 mins @1112am  no answer lvm that I will call once again and to be available by phone. @1121am  lvm to call the office back so that we can get her rescheduled.

## 2019-08-27 ENCOUNTER — Telehealth: Payer: Self-pay | Admitting: Hematology and Oncology

## 2019-08-27 ENCOUNTER — Telehealth (INDEPENDENT_AMBULATORY_CARE_PROVIDER_SITE_OTHER): Payer: Medicaid Other | Admitting: Obstetrics and Gynecology

## 2019-08-27 ENCOUNTER — Other Ambulatory Visit: Payer: Self-pay

## 2019-08-27 DIAGNOSIS — D563 Thalassemia minor: Secondary | ICD-10-CM

## 2019-08-27 DIAGNOSIS — D696 Thrombocytopenia, unspecified: Secondary | ICD-10-CM

## 2019-08-27 DIAGNOSIS — E669 Obesity, unspecified: Secondary | ICD-10-CM

## 2019-08-27 DIAGNOSIS — Z3493 Encounter for supervision of normal pregnancy, unspecified, third trimester: Secondary | ICD-10-CM

## 2019-08-27 DIAGNOSIS — Z3A31 31 weeks gestation of pregnancy: Secondary | ICD-10-CM

## 2019-08-27 NOTE — Progress Notes (Signed)
   TELEHEALTH VIRTUAL OBSTETRICS VISIT ENCOUNTER NOTE  Clinic: [facility]  I connected with Colon Flattery on 08/27/19 at 10:15 AM EST by telephone at home and verified that I am speaking with the correct person using two identifiers.   I discussed the limitations, risks, security and privacy concerns of performing an evaluation and management service by telephone and the availability of in person appointments. I also discussed with the patient that there may be a patient responsible charge related to this service. The patient expressed understanding and agreed to proceed.  Subjective:  Heidi [name] is a 19 y.o. [name] at [redacted]w[redacted]d being followed for ongoing prenatal care.  She is currently monitored for the following issues for this high-risk pregnancy and has Supervision of low-risk pregnancy; Obesity (BMI 30-39.9); Thrombocytopenia (HCC); and Alpha thalassemia silent carrier on their problem list.  Patient reports no complaints. Reports fetal movement. Denies any contractions, bleeding or leaking of fluid.   The following portions of the patient's history were reviewed and updated as appropriate: allergies, current medications, past family history, past medical history, past social history, past surgical history and problem list.   Objective:   Vitals:   08/27/19 1042  BP: 114/62  Pulse: 86    Babyscripts Data Reviewed: yes  General:  Alert, oriented and cooperative.   Mental Status: Normal mood and affect perceived. Normal judgment and thought content.  Rest of physical exam deferred due to type of encounter  Assessment and Plan:  Pregnancy: [name] at [redacted]w[redacted]d 1. Encounter for supervision of low-risk pregnancy in third trimester Routine care. Follow up u/s tomorrow  2. Obesity (BMI 30-39.9)  3. Thrombocytopenia (HCC) Likely gestational. Sp heme consult. Recommend q7-10d cbc after 35-36wks, 39wk iol  4. Alpha thalassemia silent carrier  Preterm  labor symptoms and general obstetric precautions including but not limited to vaginal bleeding, contractions, leaking of fluid and fetal movement were reviewed in detail with the patient.  I discussed the assessment and treatment plan with the patient. The patient was provided an opportunity to ask questions and all were answered. The patient agreed with the plan and demonstrated an understanding of the instructions. The patient was advised to call back or seek an in-person office evaluation/go to [facility] at [facility] for any urgent or concerning symptoms. Please refer to After Visit Summary for other counseling recommendations.   I provided 7 minutes of non-face-to-face time during this encounter. The visit was conducted via MyChart-medicine  Return in about 2 weeks (around 09/10/2019) for low risk, virtual visit.  Future Appointments  Date Time Provider Department Center  08/28/2019 12:45 PM [facility] NURSE [facility] MFC-US  08/28/2019 12:45 PM [facility] [place] 2 [facility]-MFCUS MFC-US  10/16/2019 10:30 AM [facility] LAB 2 [facility] None  10/16/2019 11:00 AM Jaci Standard, MD [facility] None    [place] Bing, MD [facility] [place], [facility]

## 2019-08-27 NOTE — Progress Notes (Signed)
Chart reviewed for nurse visit. Agree with plan of care.   Marylene Land, CNM 08/27/2019 7:49 AM

## 2019-08-27 NOTE — Progress Notes (Signed)
I connected with  Heidi [name] on 08/27/19 at 10:15 AM EST by telephone and verified that I am speaking with the correct person using two identifiers.   I discussed the limitations, risks, security and privacy concerns of performing an evaluation and management service by telephone and the availability of in person appointments. I also discussed with the patient that there may be a patient responsible charge related to this service. The patient expressed understanding and agreed to proceed.  [name], CMA 08/27/2019  10:41 AM

## 2019-08-27 NOTE — Telephone Encounter (Signed)
Called Ms. Bourquin to discuss the results of her blood work from 08/22/2019.  Findings are consistent with a mild thrombocytopenia with a platelet count of 113 (improved from prior) and a microcytic iron deficiency anemia.  Today I noted that we would be willing to offer IV iron prior to her delivery in early May and the patient noted that she would want to continue p.o. iron therapy alone.  We will plan to see the patient back just prior to her delivery to assure that her platelets are stable. Additionally we will have her back a few weeks post delivery to assure her platelets rebounded to normal and that her iron deficiency is improving on p.o. therapy.  Ulysees Barns, MD Department of Hematology/Oncology [facility] Phone: [phone] Pager: [phone] Email: [email or web address]

## 2019-08-28 ENCOUNTER — Other Ambulatory Visit: Payer: Self-pay

## 2019-08-28 ENCOUNTER — Ambulatory Visit (HOSPITAL_COMMUNITY)
Admission: RE | Admit: 2019-08-28 | Discharge: 2019-08-28 | Disposition: A | Discharge status: Home / Self Care | Payer: Medicaid Other | Source: Ambulatory Visit | Attending: Obstetrics | Admitting: Obstetrics

## 2019-08-28 ENCOUNTER — Encounter (HOSPITAL_COMMUNITY): Payer: Self-pay

## 2019-08-28 ENCOUNTER — Ambulatory Visit (HOSPITAL_COMMUNITY): Payer: Medicaid Other | Admitting: *Deleted

## 2019-08-28 DIAGNOSIS — Z3A31 31 weeks gestation of pregnancy: Secondary | ICD-10-CM | POA: Diagnosis not present

## 2019-08-28 DIAGNOSIS — Z3493 Encounter for supervision of normal pregnancy, unspecified, third trimester: Secondary | ICD-10-CM | POA: Insufficient documentation

## 2019-08-28 DIAGNOSIS — E669 Obesity, unspecified: Secondary | ICD-10-CM | POA: Insufficient documentation

## 2019-08-28 DIAGNOSIS — Z362 Encounter for other antenatal screening follow-up: Secondary | ICD-10-CM

## 2019-08-28 DIAGNOSIS — O99213 Obesity complicating pregnancy, third trimester: Secondary | ICD-10-CM | POA: Diagnosis not present

## 2019-08-28 LAB — METHYLMALONIC ACID, SERUM: Methylmalonic Acid, Quantitative: 95 nmol/L (ref 0–378)

## 2019-09-11 ENCOUNTER — Telehealth (INDEPENDENT_AMBULATORY_CARE_PROVIDER_SITE_OTHER): Payer: Medicaid Other | Admitting: Certified Nurse Midwife

## 2019-09-11 VITALS — BP 117/57 | HR 89

## 2019-09-11 DIAGNOSIS — D696 Thrombocytopenia, unspecified: Secondary | ICD-10-CM

## 2019-09-11 DIAGNOSIS — O26893 Other specified pregnancy related conditions, third trimester: Secondary | ICD-10-CM | POA: Diagnosis not present

## 2019-09-11 DIAGNOSIS — Z3493 Encounter for supervision of normal pregnancy, unspecified, third trimester: Secondary | ICD-10-CM

## 2019-09-11 DIAGNOSIS — E669 Obesity, unspecified: Secondary | ICD-10-CM

## 2019-09-11 DIAGNOSIS — R102 Pelvic and perineal pain: Secondary | ICD-10-CM

## 2019-09-11 MED ORDER — COMFORT FIT MATERNITY SUPP LG MISC: 1.0000 "application " | Freq: Every day | 0 refills | Status: DC

## 2019-09-11 NOTE — Progress Notes (Signed)
I connected with  Colon Flattery on 09/11/19 at 0817 by telephone and verified that I am speaking with the correct person using two identifiers.   I discussed the limitations, risks, security and privacy concerns of performing an evaluation and management service by telephone and the availability of in person appointments. I also discussed with the patient that there may be a patient responsible charge related to this service. The patient expressed understanding and agreed to proceed.  Marjo Bicker, RN 09/11/2019  8:17 AM

## 2019-09-11 NOTE — Patient Instructions (Signed)
AREA PEDIATRIC/FAMILY PRACTICE PHYSICIANS  [facility]  [address] [place], [place] [address] Phone - [phone]   Fax - [place] [address], [place]  [address] Phone - [phone]   Fax - [phone]  [place] [address], [place] [address] [place], [place]  [address] Phone - [phone]   Fax - [phone]  [facility] [place] [address] [place], [place]  [address] Phone - [phone]   Fax - [phone]  [place] [address] [place], [place]  [address] Phone - [phone]   Fax - [place] [address] [place], [place]  [address] Phone - [phone]   Fax - [place] [facility] [place] [address], [place], [place]  [address] Phone - [phone]   Fax - [phone]  [place] [address] [place], [place], [place]  [address] Phone - [phone]   Fax - [place] [address] [place], [place]  [address] Phone - [phone]   Fax - [phone] [facility] [place]. [address] [place], [place]  [address] Phone - [phone]   Fax - [phone]  [place] [address] [place], [place]  [address] Phone - [phone]   Fax - [phone]  [facility] AT [place], [place], [place]  [address] Phone - [phone]   Fax - [place] [address] [place], [place]  [address] Phone - [phone]   Fax - [phone]  [facility] [address], [place], [place]  [address] Phone - [place], [place]  [address] Phone - [phone]   Fax - [place] [address] [place], [place]  [address] Phone - [phone]   Fax - [phone]  [place] [address] [place], [place]  [address] Phone - [phone]   Fax - [place]. [place], [place]  [address] Phone - [phone]   Fax - [place], [place], [place]  [address] Phone - [phone]   Fax - [place] [address] [place], [place]  [address] Phone - [phone]   Fax - [phone]  [place] [address] [place], [place]  [address] Phone - [phone]   Fax - [place] [address] [place], [place]  [address] Phone - [phone]   Fax - [phone]  [place] [address] [place],   [address] Phone - [phone]   Fax - [phone] [place] [address] [place],   [address] Phone - [phone]   Fax - [place] [address] [place],   [address] Phone - [phone]   Fax - [place] [address] [place],   [address] Phone - [phone]   Fax [phone]     PREGNANCY SUPPORT BELT: You are not alone, Seventy-five percent of women have some sort of abdominal or back pain at some point in their pregnancy. Your baby is growing at a  fast pace, which means that your whole body is rapidly trying to adjust to the changes. As your uterus grows, your back may start feeling a bit under stress and this can result in back or abdominal pain that can go from mild, and therefore bearable, to severe pains that will not allow you to sit or lay down comfortably, When it comes to dealing with pregnancy-related pains and cramps, some pregnant women usually prefer natural remedies, which the market is  filled with nowadays. For example, wearing a pregnancy support belt can help ease and lessen your discomfort and pain. WHAT ARE THE BENEFITS OF WEARING A PREGNANCY SUPPORT BELT? A pregnancy support belt provides support to the lower portion of the belly taking some of the weight of the growing uterus and distributing to the other parts of your body. It is designed make you comfortable and gives you extra support. Over the years, the pregnancy apparel market has been studying the needs and wants of pregnant women and they have come up with the most comfortable pregnancy support belts that woman could ever ask for. In fact, you will no longer have to wear a stretched-out or bulky pregnancy belt that is visible underneath your clothes and makes you feel even more uncomfortable. Nowadays, a pregnancy support belt is made of comfortable and stretchy materials that will not irritate your skin but will actually make you feel at ease and you will not even notice you are wearing it. They are easy to put on and adjust during the day and can be worn at night for additional support.  BENEFITS: . Relives Back pain . Relieves Abdominal Muscle and Leg Pain . Stabilizes the Pelvic Ring . Offers a Cushioned Abdominal Lift Pad . Relieves pressure on the Sciatic Nerve Within Minutes WHERE TO GET YOUR PREGNANCY BELT: [employer] [phone] @2301  [address] [place], [place]

## 2019-09-11 NOTE — Progress Notes (Addendum)
I connected with Heidi [name] on 09/11/19 at  8:15 AM EDT by: Mychart and verified that I am speaking with the correct person using two identifiers.  Patient is located at home and provider is located at [facility] office.     The purpose of this virtual visit is to provide medical care while limiting exposure to the novel coronavirus. I discussed the limitations, risks, security and privacy concerns of performing an evaluation and management service by Mychart and the availability of in person appointments. I also discussed with the patient that there may be a patient responsible charge related to this service. By engaging in this virtual visit, you consent to the provision of healthcare.  Additionally, you authorize for your insurance to be billed for the services provided during this visit.  The patient expressed understanding and agreed to proceed.  The following staff members participated in the virtual visit:  RN and myself    PRENATAL VISIT NOTE  Subjective:  Heidi [name] is a 19 y.o. [name] at [redacted]w[redacted]d  for phone visit for ongoing prenatal care.  She is currently monitored for the following issues for this high-risk pregnancy and has Supervision of low-risk pregnancy; Obesity (BMI 30-39.9); Thrombocytopenia (HCC); and Alpha thalassemia silent carrier on their problem list.  Patient reports pelvic pain with standing and changing positions. Worse while at wrok and first getting up..  Contractions: Not present. Vag. Bleeding: None.  Movement: Present. Denies leaking of fluid.   The following portions of the patient's history were reviewed and updated as appropriate: allergies, current medications, past family history, past medical history, past social history, past surgical history and problem list.   Objective:   Vitals:   09/11/19 0818  BP: (!) 117/57  Pulse: 89   Self-Obtained  Fetal Status:     Movement: Present     Assessment and Plan:  Pregnancy: [name] at [redacted]w[redacted]d 1. Encounter for  supervision of low-risk pregnancy in third trimester  2. Thrombocytopenia (HCC) - stable, recheck at 36 weeks  3. Pelvic pain in pregnancy - likely MSK - discussed comfort measures - Rx pregnancy belt- will leave Rx for pt to pick up - PTL precautions  Preterm labor symptoms and general obstetric precautions including but not limited to vaginal bleeding, contractions, leaking of fluid and fetal movement were reviewed in detail with the patient.  Return in about 3 weeks (around 10/02/2019).  Future Appointments  Date Time Provider Department Center  10/16/2019 10:30 AM [facility] LAB 2 [facility] None  10/16/2019 11:00 AM Heidi Standard, MD [facility] None     Time spent on virtual visit: 9 minutes  Heidi [name], CNM

## 2019-10-02 ENCOUNTER — Other Ambulatory Visit: Payer: Self-pay

## 2019-10-02 ENCOUNTER — Other Ambulatory Visit (HOSPITAL_COMMUNITY)
Admission: RE | Admit: 2019-10-02 | Discharge: 2019-10-02 | Disposition: A | Discharge status: Home / Self Care | Payer: Medicaid Other | Source: Ambulatory Visit | Attending: Medical | Admitting: Medical

## 2019-10-02 ENCOUNTER — Ambulatory Visit (INDEPENDENT_AMBULATORY_CARE_PROVIDER_SITE_OTHER): Payer: Medicaid Other | Admitting: Medical

## 2019-10-02 ENCOUNTER — Encounter: Payer: Self-pay | Admitting: Medical

## 2019-10-02 VITALS — BP 116/77 | HR 103 | Wt 190.0 lb

## 2019-10-02 DIAGNOSIS — Z3A36 36 weeks gestation of pregnancy: Secondary | ICD-10-CM

## 2019-10-02 DIAGNOSIS — Z3493 Encounter for supervision of normal pregnancy, unspecified, third trimester: Secondary | ICD-10-CM | POA: Insufficient documentation

## 2019-10-02 DIAGNOSIS — D563 Thalassemia minor: Secondary | ICD-10-CM

## 2019-10-02 DIAGNOSIS — O99013 Anemia complicating pregnancy, third trimester: Secondary | ICD-10-CM

## 2019-10-02 DIAGNOSIS — O99213 Obesity complicating pregnancy, third trimester: Secondary | ICD-10-CM

## 2019-10-02 DIAGNOSIS — E669 Obesity, unspecified: Secondary | ICD-10-CM

## 2019-10-02 DIAGNOSIS — D696 Thrombocytopenia, unspecified: Secondary | ICD-10-CM

## 2019-10-02 DIAGNOSIS — O99113 Other diseases of the blood and blood-forming organs and certain disorders involving the immune mechanism complicating pregnancy, third trimester: Secondary | ICD-10-CM

## 2019-10-02 MED ORDER — COMFORT FIT MATERNITY SUPP LG MISC: 1.0000 | Freq: Once | 0 refills | Status: DC

## 2019-10-02 MED ORDER — COMFORT FIT MATERNITY SUPP LG MISC: 1.0000 | Freq: Once | 0 refills | Status: AC

## 2019-10-02 NOTE — Progress Notes (Signed)
   PRENATAL VISIT NOTE  Subjective:  Heidi [name] is a 19 y.o. [name] at [redacted]w[redacted]d being seen today for ongoing prenatal care.  She is currently monitored for the following issues for this high-risk pregnancy and has Supervision of low-risk pregnancy; Obesity (BMI 30-39.9); Thrombocytopenia (HCC); and Alpha thalassemia silent carrier on their problem list.  Patient reports no complaints.  Contractions: Not present. Vag. Bleeding: None.  Movement: Present. Denies leaking of fluid.   The following portions of the patient's history were reviewed and updated as appropriate: allergies, current medications, past family history, past medical history, past social history, past surgical history and problem list.   Objective:   Vitals:   10/02/19 1533  BP: 116/77  Pulse: (!) 103  Weight: 190 lb (86.2 kg)    Fetal Status: Fetal Heart Rate (bpm): 136   Movement: Present  Presentation: Vertex  General:  Alert, oriented and cooperative. Patient is in no acute distress.  Skin: Skin is warm and dry. No rash noted.   Cardiovascular: Normal heart rate noted  Respiratory: Normal respiratory effort, no problems with respiration noted  Abdomen: Soft, gravid, appropriate for gestational age.  Pain/Pressure: Absent     Pelvic: Cervical exam performed in the presence of a chaperone Dilation: 1 Effacement (%): 70 Station: -3  Extremities: Normal range of motion.  Edema: None  Mental Status: Normal mood and affect. Normal behavior. Normal judgment and thought content.   Assessment and Plan:  Pregnancy: [name] at [redacted]w[redacted]d 1. Encounter for supervision of low-risk pregnancy in third trimester - Cervicovaginal ancillary only( [place]) - Culture, beta strep (group b only) - Reviewed the importance of choosing a pediatrician prior to delivery    2. Thrombocytopenia (HCC) - CBC today and q 7-10 days until delivery  - IOL form sent for 39 week IOL per Hematology   3. Alpha thalassemia silent carrier  4.  Obesity (BMI 30-39.9)  Preterm labor symptoms and general obstetric precautions including but not limited to vaginal bleeding, contractions, leaking of fluid and fetal movement were reviewed in detail with the patient. Please refer to After Visit Summary for other counseling recommendations.   Return in about 1 week (around 10/09/2019) for [facility] APP, In-Person.  Future Appointments  Date Time Provider Department Center  10/16/2019 10:30 AM [facility] LAB 2 [facility] None  10/16/2019 11:00 AM Jaci Standard, MD [facility] None    Vonzella Nipple, PA-C

## 2019-10-02 NOTE — Patient Instructions (Signed)
Fetal Movement Counts Patient Name: ________________________________________________ Patient Due Date: ____________________ What is a fetal movement count?  A fetal movement count is the number of times that you feel your baby move during a certain amount of time. This may also be called a fetal kick count. A fetal movement count is recommended for every pregnant woman. You may be asked to start counting fetal movements as early as week 28 of your pregnancy. Pay attention to when your baby is most active. You may notice your baby's sleep and wake cycles. You may also notice things that make your baby move more. You should do a fetal movement count:  When your baby is normally most active.  At the same time each day. A good time to count movements is while you are resting, after having something to eat and drink. How do I count fetal movements? 1. Find a quiet, comfortable area. Sit, or lie down on your side. 2. Write down the date, the start time and stop time, and the number of movements that you felt between those two times. Take this information with you to your health care visits. 3. Write down your start time when you feel the first movement. 4. Count kicks, flutters, swishes, rolls, and jabs. You should feel at least 10 movements. 5. You may stop counting after you have felt 10 movements, or if you have been counting for 2 hours. Write down the stop time. 6. If you do not feel 10 movements in 2 hours, contact your health care provider for further instructions. Your health care provider may want to do additional tests to assess your baby's well-being. Contact a health care provider if:  You feel fewer than 10 movements in 2 hours.  Your baby is not moving like he or she usually does. Date: ____________ Start time: ____________ Stop time: ____________ Movements: ____________ Date: ____________ Start time: ____________ Stop time: ____________ Movements: ____________ Date: ____________  Start time: ____________ Stop time: ____________ Movements: ____________ Date: ____________ Start time: ____________ Stop time: ____________ Movements: ____________ Date: ____________ Start time: ____________ Stop time: ____________ Movements: ____________ Date: ____________ Start time: ____________ Stop time: ____________ Movements: ____________ Date: ____________ Start time: ____________ Stop time: ____________ Movements: ____________ Date: ____________ Start time: ____________ Stop time: ____________ Movements: ____________ Date: ____________ Start time: ____________ Stop time: ____________ Movements: ____________ This information is not intended to replace advice given to you by your health care provider. Make sure you discuss any questions you have with your health care provider. Document Revised: 01/30/2019 Document Reviewed: 01/30/2019 Elsevier Patient Education  2020 [employer] Braxton Hicks Contractions Contractions of the uterus can occur throughout pregnancy, but they are not always a sign that you are in labor. You may have practice contractions called Braxton Hicks contractions. These false labor contractions are sometimes confused with true labor. What are Braxton Hicks contractions? Braxton Hicks contractions are tightening movements that occur in the muscles of the uterus before labor. Unlike true labor contractions, these contractions do not result in opening (dilation) and thinning of the cervix. Toward the end of pregnancy (32-34 weeks), Braxton Hicks contractions can happen more often and may become stronger. These contractions are sometimes difficult to tell apart from true labor because they can be very uncomfortable. You should not feel embarrassed if you go to the hospital with false labor. Sometimes, the only way to tell if you are in true labor is for your health care provider to look for changes in the cervix. The health care provider   will do a physical exam and may  monitor your contractions. If you are not in true labor, the exam should show that your cervix is not dilating and your water has not broken. If there are no other health problems associated with your pregnancy, it is completely safe for you to be sent home with false labor. You may continue to have Braxton Hicks contractions until you go into true labor. How to tell the difference between true labor and false labor True labor  Contractions last 30-70 seconds.  Contractions become very regular.  Discomfort is usually felt in the top of the uterus, and it spreads to the lower abdomen and low back.  Contractions do not go away with walking.  Contractions usually become more intense and increase in frequency.  The cervix dilates and gets thinner. False labor  Contractions are usually shorter and not as strong as true labor contractions.  Contractions are usually irregular.  Contractions are often felt in the front of the lower abdomen and in the groin.  Contractions may go away when you walk around or change positions while lying down.  Contractions get weaker and are shorter-lasting as time goes on.  The cervix usually does not dilate or become thin. Follow these instructions at home:   Take over-the-counter and prescription medicines only as told by your health care provider.  Keep up with your usual exercises and follow other instructions from your health care provider.  Eat and drink lightly if you think you are going into labor.  If Braxton Hicks contractions are making you uncomfortable: ? Change your position from lying down or resting to walking, or change from walking to resting. ? Sit and rest in a tub of warm water. ? Drink enough fluid to keep your urine pale yellow. Dehydration may cause these contractions. ? Do slow and deep breathing several times an hour.  Keep all follow-up prenatal visits as told by your health care provider. This is important. Contact a  health care provider if:  You have a fever.  You have continuous pain in your abdomen. Get help right away if:  Your contractions become stronger, more regular, and closer together.  You have fluid leaking or gushing from your vagina.  You pass blood-tinged mucus (bloody show).  You have bleeding from your vagina.  You have low back pain that you never had before.  You feel your baby's head pushing down and causing pelvic pressure.  Your baby is not moving inside you as much as it used to. Summary  Contractions that occur before labor are called Braxton Hicks contractions, false labor, or practice contractions.  Braxton Hicks contractions are usually shorter, weaker, farther apart, and less regular than true labor contractions. True labor contractions usually become progressively stronger and regular, and they become more frequent.  Manage discomfort from Braxton Hicks contractions by changing position, resting in a warm bath, drinking plenty of water, or practicing deep breathing. This information is not intended to replace advice given to you by your health care provider. Make sure you discuss any questions you have with your health care provider. Document Revised: 05/25/2017 Document Reviewed: 10/26/2016 Elsevier Patient Education  2020 Elsevier Inc.  

## 2019-10-02 NOTE — Progress Notes (Signed)
Induction Assessment Scheduling Form: Fax to [facility]:  [phone] Route to [facility] Labor Delivery   Heidi [name]                                                                                   DOB:  2000/10/25                                                            MRN:  [id number]  Phone:  Home Phone [phone]  Mobile [phone]    Provider:  [facility] (Faculty Practice)  GP:  [name]                                                            Estimated Date of Delivery: 10/27/19  Dating Criteria: [place] at 23 weeks    Medical Indications for induction:  Thrombocytopenia  Admission Date/Time:  10/20/19 Gestational age on admission:  51   Filed Weights   10/02/19 1533  Weight: 190 lb (86.2 kg)   HIV:  Non Reactive (02/02 0832) GBS:    Dilation: 1 Effacement (%): 70 Station: -3 Presentation: Vertex  Method of induction(proposed):  Pitocin   Scheduling Provider Signature:  Vonzella Nipple, PA-C                                            Today's Date:  10/02/2019

## 2019-10-03 LAB — CERVICOVAGINAL ANCILLARY ONLY
Chlamydia: NEGATIVE
Comment: NEGATIVE
Comment: NORMAL
Neisseria Gonorrhea: NEGATIVE

## 2019-10-03 LAB — CBC
Hematocrit: 31.1 % — ABNORMAL LOW (ref 34.0–46.6)
Hemoglobin: 10.4 g/dL — ABNORMAL LOW (ref 11.1–15.9)
MCH: 28.4 pg (ref 26.6–33.0)
MCHC: 33.4 g/dL (ref 31.5–35.7)
MCV: 85 fL (ref 79–97)
Platelets: 94 10*3/uL — CL (ref 150–450)
RBC: 3.66 x10E6/uL — ABNORMAL LOW (ref 3.77–5.28)
RDW: 13.8 % (ref 11.7–15.4)
WBC: 6.4 10*3/uL (ref 3.4–10.8)

## 2019-10-06 LAB — CULTURE, BETA STREP (GROUP B ONLY): Strep Gp B Culture: NEGATIVE

## 2019-10-08 ENCOUNTER — Other Ambulatory Visit: Payer: Self-pay

## 2019-10-08 ENCOUNTER — Encounter: Payer: Self-pay | Admitting: Certified Nurse Midwife

## 2019-10-08 ENCOUNTER — Telehealth (HOSPITAL_COMMUNITY): Payer: Self-pay | Admitting: *Deleted

## 2019-10-08 ENCOUNTER — Ambulatory Visit (INDEPENDENT_AMBULATORY_CARE_PROVIDER_SITE_OTHER): Payer: Medicaid Other | Admitting: Certified Nurse Midwife

## 2019-10-08 VITALS — BP 109/67 | HR 80 | Wt 195.4 lb

## 2019-10-08 DIAGNOSIS — O99213 Obesity complicating pregnancy, third trimester: Secondary | ICD-10-CM

## 2019-10-08 DIAGNOSIS — O99113 Other diseases of the blood and blood-forming organs and certain disorders involving the immune mechanism complicating pregnancy, third trimester: Secondary | ICD-10-CM

## 2019-10-08 DIAGNOSIS — Z3493 Encounter for supervision of normal pregnancy, unspecified, third trimester: Secondary | ICD-10-CM

## 2019-10-08 DIAGNOSIS — E669 Obesity, unspecified: Secondary | ICD-10-CM

## 2019-10-08 DIAGNOSIS — Z3A37 37 weeks gestation of pregnancy: Secondary | ICD-10-CM

## 2019-10-08 DIAGNOSIS — D563 Thalassemia minor: Secondary | ICD-10-CM

## 2019-10-08 DIAGNOSIS — D696 Thrombocytopenia, unspecified: Secondary | ICD-10-CM

## 2019-10-08 NOTE — Telephone Encounter (Signed)
Preadmission screen  

## 2019-10-08 NOTE — Progress Notes (Signed)
   PRENATAL VISIT NOTE  Subjective:  Heidi [name] is a 19 y.o. [name] at [redacted]w[redacted]d being seen today for ongoing prenatal care.  She is currently monitored for the following issues for this high-risk pregnancy and has Supervision of low-risk pregnancy; Obesity (BMI 30-39.9); Thrombocytopenia (HCC); and Alpha thalassemia silent carrier on their problem list.  Patient reports no complaints.  Contractions: Not present. Vag. Bleeding: None.  Movement: Present. Denies leaking of fluid.   The following portions of the patient's history were reviewed and updated as appropriate: allergies, current medications, past family history, past medical history, past social history, past surgical history and problem list.   Objective:   Vitals:   10/08/19 1604  BP: 109/67  Pulse: 80  Weight: 195 lb 6.4 oz (88.6 kg)    Fetal Status: Fetal Heart Rate (bpm): 143   Movement: Present     General:  Alert, oriented and cooperative. Patient is in no acute distress.  Skin: Skin is warm and dry. No rash noted.   Cardiovascular: Normal heart rate noted  Respiratory: Normal respiratory effort, no problems with respiration noted  Abdomen: Soft, gravid, appropriate for gestational age.  Pain/Pressure: Absent     Pelvic: Cervical exam deferred        Extremities: Normal range of motion.  Edema: None  Mental Status: Normal mood and affect. Normal behavior. Normal judgment and thought content.   Assessment and Plan:  Pregnancy: [name] at [redacted]w[redacted]d 1. Encounter for supervision of low-risk pregnancy in third trimester - high risk d/t thrombocytopenia otherwise low risk pregnancy  - patient doing well, no complaints - patient has IOL scheduled for 4/26, educated and discussed with patient process of induction and OP FB option  - Patient agrees to OP FB, will present to MAU on 4/25 after 8pm for FB insertion  - Labor precautions and reasons to present to MAU discussed   2. Thrombocytopenia (HCC) - recent PLT 94 on 4/8,  repeat CBC at next prenatal appointment   3. Obesity (BMI 30-39.9)  4. Alpha thalassemia silent carrier   Term labor symptoms and general obstetric precautions including but not limited to vaginal bleeding, contractions, leaking of fluid and fetal movement were reviewed in detail with the patient. Please refer to After Visit Summary for other counseling recommendations.   Return in about 5 days (around 10/13/2019) for ROB/lab- repeat CBC .  Future Appointments  Date Time Provider Department Center  10/16/2019 10:30 AM [facility] LAB 2 [facility] None  10/16/2019 11:00 AM Jaci Standard, MD [facility] None  10/16/2019  3:15 PM Hermina Staggers, MD [facility]  10/18/2019  9:40 AM [facility] None  10/20/2019  6:30 AM [facility] SCHED ROOM [facility] None    Sharyon Cable, CNM

## 2019-10-08 NOTE — Patient Instructions (Signed)
Please arrive to the hospital for outpatient foley bulb on 4/25 after 8pm - you will go to Maternity Assessment Unit at [facility] and [facility]   New Induction of Labor Process for [facility]  In Fall 2020 [facility] changed it's process for scheduling inductions of labor to create more induction slots and to make sure patients get COVID-19 testing in advance. After you have been tested you need to quarantine so that you do not get infected after your test. You should not go anywhere after your test except necessary medical appointments.  You have been scheduled for induction of labor on 4/26. Although you may have a specific time listed on your After Visit Summary or MyChart, we cannot predict when your room will be available. Please disregard this time. A Labor and Delivery staff member will call you on the day that you are scheduled when your room is available. You will need to arrive within one hour of being called. If you do not arrive within this time frame, the next person on the list will be called in and you will move down the list. You may eat a light meal before coming to the hospital. If you go into labor, think your water has broken, experience bright red bleeding or don't feel your baby moving as much as usual before your induction, please call your Ob/Gyn's office or come to Entrance C, Maternity Assessment Unit for evaluation.  Thank you,  Center [facility] [employer]

## 2019-10-13 ENCOUNTER — Telehealth (HOSPITAL_COMMUNITY): Payer: Self-pay | Admitting: *Deleted

## 2019-10-13 ENCOUNTER — Encounter (HOSPITAL_COMMUNITY): Payer: Self-pay | Admitting: *Deleted

## 2019-10-13 NOTE — Telephone Encounter (Signed)
Preadmission screen  

## 2019-10-15 ENCOUNTER — Other Ambulatory Visit: Payer: Self-pay | Admitting: Advanced Practice Midwife

## 2019-10-16 ENCOUNTER — Ambulatory Visit (INDEPENDENT_AMBULATORY_CARE_PROVIDER_SITE_OTHER): Payer: Medicaid Other | Admitting: Obstetrics and Gynecology

## 2019-10-16 ENCOUNTER — Inpatient Hospital Stay: Payer: Medicaid Other | Attending: Hematology and Oncology | Admitting: Hematology and Oncology

## 2019-10-16 ENCOUNTER — Inpatient Hospital Stay: Payer: Medicaid Other

## 2019-10-16 ENCOUNTER — Encounter: Payer: Self-pay | Admitting: Obstetrics and Gynecology

## 2019-10-16 ENCOUNTER — Other Ambulatory Visit: Payer: Self-pay | Admitting: Hematology and Oncology

## 2019-10-16 ENCOUNTER — Other Ambulatory Visit: Payer: Self-pay

## 2019-10-16 ENCOUNTER — Encounter: Payer: Self-pay | Admitting: Hematology and Oncology

## 2019-10-16 VITALS — BP 108/64 | HR 74 | Wt 203.8 lb

## 2019-10-16 VITALS — BP 108/60 | HR 83 | Temp 98.5°F | Resp 17 | Ht 60.0 in | Wt 202.3 lb

## 2019-10-16 DIAGNOSIS — O99019 Anemia complicating pregnancy, unspecified trimester: Secondary | ICD-10-CM | POA: Diagnosis not present

## 2019-10-16 DIAGNOSIS — D563 Thalassemia minor: Secondary | ICD-10-CM

## 2019-10-16 DIAGNOSIS — D696 Thrombocytopenia, unspecified: Secondary | ICD-10-CM

## 2019-10-16 DIAGNOSIS — D500 Iron deficiency anemia secondary to blood loss (chronic): Secondary | ICD-10-CM | POA: Diagnosis not present

## 2019-10-16 DIAGNOSIS — Z3493 Encounter for supervision of normal pregnancy, unspecified, third trimester: Secondary | ICD-10-CM

## 2019-10-16 DIAGNOSIS — D509 Iron deficiency anemia, unspecified: Secondary | ICD-10-CM | POA: Insufficient documentation

## 2019-10-16 DIAGNOSIS — Z3A38 38 weeks gestation of pregnancy: Secondary | ICD-10-CM

## 2019-10-16 DIAGNOSIS — O99113 Other diseases of the blood and blood-forming organs and certain disorders involving the immune mechanism complicating pregnancy, third trimester: Secondary | ICD-10-CM

## 2019-10-16 DIAGNOSIS — O99119 Other diseases of the blood and blood-forming organs and certain disorders involving the immune mechanism complicating pregnancy, unspecified trimester: Secondary | ICD-10-CM | POA: Insufficient documentation

## 2019-10-16 LAB — CBC WITH DIFFERENTIAL (CANCER CENTER ONLY)
Abs Immature Granulocytes: 0.06 10*3/uL (ref 0.00–0.07)
Basophils Absolute: 0 10*3/uL (ref 0.0–0.1)
Basophils Relative: 0 %
Eosinophils Absolute: 0.1 10*3/uL (ref 0.0–0.5)
Eosinophils Relative: 1 %
HCT: 28.5 % — ABNORMAL LOW (ref 36.0–46.0)
Hemoglobin: 9.8 g/dL — ABNORMAL LOW (ref 12.0–15.0)
Immature Granulocytes: 1 %
Lymphocytes Relative: 21 %
Lymphs Abs: 1.4 10*3/uL (ref 0.7–4.0)
MCH: 29.3 pg (ref 26.0–34.0)
MCHC: 34.4 g/dL (ref 30.0–36.0)
MCV: 85.3 fL (ref 80.0–100.0)
Monocytes Absolute: 0.6 10*3/uL (ref 0.1–1.0)
Monocytes Relative: 10 %
Neutro Abs: 4.6 10*3/uL (ref 1.7–7.7)
Neutrophils Relative %: 67 %
Platelet Count: 81 10*3/uL — ABNORMAL LOW (ref 150–400)
RBC: 3.34 MIL/uL — ABNORMAL LOW (ref 3.87–5.11)
RDW: 14.1 % (ref 11.5–15.5)
WBC Count: 6.7 10*3/uL (ref 4.0–10.5)
nRBC: 0 % (ref 0.0–0.2)

## 2019-10-16 LAB — CMP (CANCER CENTER ONLY)
ALT: 14 U/L (ref 0–44)
AST: 20 U/L (ref 15–41)
Albumin: 2.6 g/dL — ABNORMAL LOW (ref 3.5–5.0)
Alkaline Phosphatase: 136 U/L — ABNORMAL HIGH (ref 38–126)
Anion gap: 6 (ref 5–15)
BUN: 9 mg/dL (ref 6–20)
CO2: 21 mmol/L — ABNORMAL LOW (ref 22–32)
Calcium: 8.6 mg/dL — ABNORMAL LOW (ref 8.9–10.3)
Chloride: 109 mmol/L (ref 98–111)
Creatinine: 0.67 mg/dL (ref 0.44–1.00)
GFR, Est AFR Am: >60 mL/min (ref >60)
GFR, Estimated: >60 mL/min (ref >60)
Glucose, Bld: 120 mg/dL — ABNORMAL HIGH (ref 70–99)
Potassium: 3.6 mmol/L (ref 3.5–5.1)
Sodium: 136 mmol/L (ref 135–145)
Total Bilirubin: <0.2 mg/dL — ABNORMAL LOW (ref 0.3–1.2)
Total Protein: 6.1 g/dL — ABNORMAL LOW (ref 6.5–8.1)

## 2019-10-16 LAB — RETIC PANEL
Immature Retic Fract: 16.3 % — ABNORMAL HIGH (ref 2.3–15.9)
RBC.: 3.41 MIL/uL — ABNORMAL LOW (ref 3.87–5.11)
Retic Count, Absolute: 61 10*3/uL (ref 19.0–186.0)
Retic Ct Pct: 1.8 % (ref 0.4–3.1)
Reticulocyte Hemoglobin: 34.4 pg (ref >27.9)

## 2019-10-16 LAB — IRON AND TIBC
Iron: 93 ug/dL (ref 41–142)
Saturation Ratios: 25 % (ref 21–57)
TIBC: 376 ug/dL (ref 236–444)
UIBC: 283 ug/dL (ref 120–384)

## 2019-10-16 LAB — FERRITIN: Ferritin: 26 ng/mL (ref 11–307)

## 2019-10-16 LAB — SAVE SMEAR(SSMR), FOR PROVIDER SLIDE REVIEW

## 2019-10-16 NOTE — Patient Instructions (Signed)

## 2019-10-16 NOTE — Progress Notes (Signed)
[place] [phone]   [phone]  PROGRESS NOTE  Patient Care Team: Patient, No Pcp Per as PCP - General (General Practice)  Hematological/Oncological History #Thrombocytopenia in Pregnancy 1) 07/01/2019: WBC 4.9, Hgb 9.8, MCV 88, Plt 99 2) 07/29/2019: WBC 7.5, Hgb 10.1, MCV 88, Plt 98 3) 10/16/2019: WBC 6.7, Hgb 9.8, MCV 85.3, Plt 81. Delivery date moved to 10/20/2019.   Interval History:  Heidi [name] 19 y.o. female with medical history significant for thrombocytopenia in pregnancy who presents for a follow up visit. The patient's last visit was on 07/29/2019 at which time she established care. In the interim since the last visit the patient's delivery date was moved to 10/20/2019.   On exam today Heidi [name] notes that she feels well.  She notes that she has not noticed any difference in her energy level with the administration of p.o. iron.  She also notes that she has not had any symptoms as a result of the p.o. iron including constipation or upset stomach.  Of note she reports her stools been darker in color but not black.  She also denies any other overt signs of bleeding such as nosebleeds, bruising, or frank blood in the stools.  Patient notes that she is very anxious to deliver her baby and that she can feel the baby kicking and moving quite frequently.  Otherwise she reports she feels quite well.  She denies any fevers, chills, sweats, nausea, vomiting or diarrhea.  A full 10 point ROS is listed below.  MEDICAL HISTORY:  Past Medical History:  Diagnosis Date  . Medical history non-contributory     SURGICAL HISTORY: Past Surgical History:  Procedure Laterality Date  . NO PAST SURGERIES     ALLERGIES:  is allergic to strawberry extract.  MEDICATIONS:  Current Outpatient Medications  Medication Sig Dispense Refill  . Blood Pressure Monitoring (BLOOD PRESSURE KIT) DEVI 1 Device by Does not apply route as needed. 1 each 0  . docusate  sodium (COLACE) 50 MG capsule Take 1 capsule (50 mg total) by mouth daily as needed for mild constipation. 30 capsule 3  . ferrous gluconate (FERGON) 324 MG tablet Take 1 tablet (324 mg total) by mouth daily with breakfast. 30 tablet 3  . Prenatal Vit-Fe Fumarate-FA (MULTIVITAMIN-PRENATAL) 27-0.8 MG TABS tablet Take 1 tablet by mouth daily at 12 noon. 30 tablet 6   No current facility-administered medications for this visit.    REVIEW OF SYSTEMS:   Constitutional: ( - ) fevers, ( - )  chills , ( - ) night sweats Eyes: ( - ) blurriness of vision, ( - ) double vision, ( - ) watery eyes Ears, nose, mouth, throat, and face: ( - ) mucositis, ( - ) sore throat Respiratory: ( - ) cough, ( - ) dyspnea, ( - ) wheezes Cardiovascular: ( - ) palpitation, ( - ) chest discomfort, ( - ) lower extremity swelling Gastrointestinal:  ( - ) nausea, ( - ) heartburn, ( - ) change in bowel habits Skin: ( - ) abnormal skin rashes Lymphatics: ( - ) new lymphadenopathy, ( - ) easy bruising Neurological: ( - ) numbness, ( - ) tingling, ( - ) new weaknesses Behavioral/Psych: ( - ) mood change, ( - ) new changes  All other systems were reviewed with the patient and are negative.  PHYSICAL EXAMINATION: ECOG PERFORMANCE STATUS: 1 - Symptomatic but completely ambulatory  Vitals:   10/16/19 1103  BP: 108/60  Pulse: 83  Resp: 17  Temp: 98.5 F (36.9 C)  SpO2: 100%   Filed Weights   10/16/19 1103  Weight: 202 lb 4.8 oz (91.8 kg)    GENERAL: well appearing young African American female in NAD  SKIN: skin color, texture, turgor are normal, no rashes or significant lesions EYES: conjunctiva are pink and non-injected, sclera clear LUNGS: clear to auscultation and percussion with normal breathing effort HEART: regular rate & rhythm and no murmurs and no lower extremity edema ABDOMEN: gravid uterus.  Musculoskeletal: no cyanosis of digits and no clubbing  PSYCH: alert & oriented x 3, fluent speech NEURO: no  focal motor/sensory deficits   LABORATORY DATA:  I have reviewed the data as listed CBC Latest Ref Rng & Units 10/16/2019 10/02/2019 08/22/2019  WBC 4.0 - 10.5 K/uL 6.7 6.4 6.6  Hemoglobin 12.0 - 15.0 g/dL 9.8(L) 10.4(L) 9.9(L)  Hematocrit 36.0 - 46.0 % 28.5(L) 31.1(L) 29.1(L)  Platelets 150 - 400 K/uL 81(L) 94(LL) 113(L)    CMP Latest Ref Rng & Units 10/16/2019 08/22/2019  Glucose 70 - 99 mg/dL 120(H) 103(H)  BUN 6 - 20 mg/dL 9 8  Creatinine 0.44 - 1.00 mg/dL 0.67 0.55  Sodium 135 - 145 mmol/L 136 137  Potassium 3.5 - 5.1 mmol/L 3.6 3.7  Chloride 98 - 111 mmol/L 109 107  CO2 22 - 32 mmol/L 21(L) 22  Calcium 8.9 - 10.3 mg/dL 8.6(L) 8.6(L)  Total Protein 6.5 - 8.1 g/dL 6.1(L) 6.7  Total Bilirubin 0.3 - 1.2 mg/dL <0.2(L) <0.2(L)  Alkaline Phos 38 - 126 U/L 136(H) 75  AST 15 - 41 U/L 20 12(L)  ALT 0 - 44 U/L 14 7    RADIOGRAPHIC STUDIES: None relevant to review.  No results found.  ASSESSMENT & PLAN Heidi [name] 19 y.o. female with medical history significant for thrombocytopenia in pregnancy who presents for a follow up visit.  On review today it appears that the patient's labs have remained stable.  Hemoglobin is at his baseline of approximately 10 and the platelets have dropped slightly to 81, but still relatively within range of prior checks.  Fortunately these platelets are adequate for epidural administration and for safe delivery.  We do recommend close monitoring for bleeding and checking of the hemoglobin post delivery.  We also recommend the patient resume her p.o. iron following delivery.  We will have her follow-up in approximately 2 to 3 months time to assure that her counts are returning to normal.   #Thrombocytopenia in Pregnancy --most likely etiology for this patient's thrombocytopenia is benign gestational thrombocytopenia ( J Prenat Med. 2011;5(4):90-92). --in order for safe deliver, we recommend a platelet count of 50K. In order to undergo an epidural we  recommend 70k ( Anesthesiology. 2017 Jun;126(6):1053-1063.). Otherwise well pregnant patient do not require intervention until plts reach 30k.  --recommend the primary service call hematology team if platelets are found to be beneath 70k prior to delivery --we anticipate Plts will return to normal post delivery and will schedule f/u for a 8-12 week post partum visit to assure counts and returning to normal.   #Iron Deficiency Anemia in Pregnancy --Hgb has remained relatively unchanged at 9.8. Iron studies appear to be improving --likely to improve after delivery, possible anemia of pregnancy with dilutional effect  --monitor Hgb in the peri-natal period --please have patient resume her PO iron 391m daily in the post partum period  --RTC in 8-12 weeks to assure counts trending back to normal.   No orders of the defined  types were placed in this encounter.   All questions were answered. The patient knows to call the clinic with any problems, questions or concerns.  A total of more than 20 minutes were spent on this encounter and over half of that time was spent on counseling and coordination of care as outlined above.   Ledell Peoples, MD Department of Hematology/Oncology [place] at [facility] Phone: [phone] Pager: [phone] Email: [email or web address]  10/16/2019 4:59 PM

## 2019-10-16 NOTE — Progress Notes (Signed)
Subjective:  Heidi [name] is a 19 y.o. [name] at [redacted]w[redacted]d being seen today for ongoing prenatal care.  She is currently monitored for the following issues for this high-risk pregnancy and has Supervision of low-risk pregnancy; Obesity (BMI 30-39.9); Thrombocytopenia (HCC); and Alpha thalassemia silent carrier on their problem list.  Patient reports general discomforts of pregnancy.  Contractions: Not present. Vag. Bleeding: None.  Movement: Present. Denies leaking of fluid.   The following portions of the patient's history were reviewed and updated as appropriate: allergies, current medications, past family history, past medical history, past social history, past surgical history and problem list. Problem list updated.  Objective:   Vitals:   10/16/19 1538  BP: 108/64  Pulse: 74  Weight: 203 lb 12.8 oz (92.4 kg)    Fetal Status: Fetal Heart Rate (bpm): 151   Movement: Present     General:  Alert, oriented and cooperative. Patient is in no acute distress.  Skin: Skin is warm and dry. No rash noted.   Cardiovascular: Normal heart rate noted  Respiratory: Normal respiratory effort, no problems with respiration noted  Abdomen: Soft, gravid, appropriate for gestational age. Pain/Pressure: Absent     Pelvic:  Cervical exam deferred        Extremities: Normal range of motion.  Edema: Trace  Mental Status: Normal mood and affect. Normal behavior. Normal judgment and thought content.   Urinalysis:      Assessment and Plan:  Pregnancy: [name] at [redacted]w[redacted]d  1. Encounter for supervision of low-risk pregnancy in third trimester Labor precautions  2. Thrombocytopenia (HCC) Plt 81 K today IOL 4/26 Foley on 4/25  3. Alpha thalassemia silent carrier   Term labor symptoms and general obstetric precautions including but not limited to vaginal bleeding, contractions, leaking of fluid and fetal movement were reviewed in detail with the patient. Please refer to After Visit Summary for other  counseling recommendations.  Return in about 4 weeks (around 11/13/2019) for PP visit from 10/21/19.   Hermina Staggers, MD

## 2019-10-18 ENCOUNTER — Other Ambulatory Visit (HOSPITAL_COMMUNITY)
Admission: RE | Admit: 2019-10-18 | Discharge: 2019-10-18 | Disposition: A | Discharge status: Home / Self Care | Payer: Medicaid Other | Source: Ambulatory Visit | Attending: Family Medicine | Admitting: Family Medicine

## 2019-10-18 DIAGNOSIS — Z20822 Contact with and (suspected) exposure to covid-19: Secondary | ICD-10-CM | POA: Diagnosis not present

## 2019-10-18 DIAGNOSIS — Z01812 Encounter for preprocedural laboratory examination: Secondary | ICD-10-CM | POA: Diagnosis not present

## 2019-10-18 LAB — SARS CORONAVIRUS 2 (TAT 6-24 HRS): SARS Coronavirus 2: NEGATIVE

## 2019-10-19 ENCOUNTER — Other Ambulatory Visit: Payer: Self-pay

## 2019-10-19 ENCOUNTER — Inpatient Hospital Stay (EMERGENCY_DEPARTMENT_HOSPITAL)
Admission: AD | Admit: 2019-10-19 | Discharge: 2019-10-19 | Disposition: A | Discharge status: Home / Self Care | Payer: Medicaid Other | Source: Home / Self Care | Attending: Obstetrics and Gynecology | Admitting: Obstetrics and Gynecology

## 2019-10-19 DIAGNOSIS — Z3A38 38 weeks gestation of pregnancy: Secondary | ICD-10-CM

## 2019-10-19 DIAGNOSIS — Z3490 Encounter for supervision of normal pregnancy, unspecified, unspecified trimester: Secondary | ICD-10-CM

## 2019-10-19 DIAGNOSIS — O26893 Other specified pregnancy related conditions, third trimester: Secondary | ICD-10-CM | POA: Insufficient documentation

## 2019-10-19 DIAGNOSIS — Z3403 Encounter for supervision of normal first pregnancy, third trimester: Secondary | ICD-10-CM

## 2019-10-19 NOTE — MAU Note (Signed)
Pt here for foley bulb placement. No complaints.

## 2019-10-19 NOTE — MAU Note (Signed)
Called patient to come back, but registration says she is in the bathroom

## 2019-10-19 NOTE — Discharge Instructions (Signed)

## 2019-10-19 NOTE — MAU Provider Note (Signed)
HPI Heidi [name] is a 19 y.o. female  [name] _0 .6 wks here for outpatient foley placement for cervical ripening. Pt reports occasional contractions. She denies VB or LOF. She reports normal fetal movement. All other systems negative.    No LMP recorded (lmp unknown). Patient is pregnant.  OB History  Gravida Para Term Preterm AB Living  1         0  SAB TAB Ectopic Multiple Live Births               # Outcome Date GA Lbr Len/2nd Weight Sex Delivery Anes PTL Lv  1 Current             Past Medical History:  Diagnosis Date  . Medical history non-contributory     Family History  Problem Relation Age of Onset  . Diabetes Mother   . Hypertension Mother     Social History   Socioeconomic History  . Marital status: Single    Spouse name: Not on file  . Number of children: Not on file  . Years of education: Not on file  . Highest education level: Not on file  Occupational History  . Not on file  Tobacco Use  . Smoking status: Never Smoker  . Smokeless tobacco: Never Used  Substance and Sexual Activity  . Alcohol use: No  . Drug use: No  . Sexual activity: Yes    Birth control/protection: None  Other Topics Concern  . Not on file  Social History Narrative  . Not on file   Social Determinants of Health   Financial Resource Strain:   . Difficulty of Paying Living Expenses:   Food Insecurity: No Food Insecurity  . Worried About Charity fundraiser in the Last Year: Never true  . Ran Out of Food in the Last Year: Never true  Transportation Needs: No Transportation Needs  . Lack of Transportation (Medical): No  . Lack of Transportation (Non-Medical): No  Physical Activity:   . Days of Exercise per Week:   . Minutes of Exercise per Session:   Stress:   . Feeling of Stress :   Social Connections:   . Frequency of Communication with Friends and Family:   . Frequency of Social Gatherings with Friends and Family:   . Attends Religious Services:   . Active Member  of Clubs or Organizations:   . Attends Archivist Meetings:   Marland Kitchen Marital Status:     Allergies  Allergen Reactions  . Strawberry Extract     No current facility-administered medications on file prior to encounter.   Current Outpatient Medications on File Prior to Encounter  Medication Sig Dispense Refill  . Blood Pressure Monitoring (BLOOD PRESSURE KIT) DEVI 1 Device by Does not apply route as needed. 1 each 0  . docusate sodium (COLACE) 50 MG capsule Take 1 capsule (50 mg total) by mouth daily as needed for mild constipation. 30 capsule 3  . ferrous gluconate (FERGON) 324 MG tablet Take 1 tablet (324 mg total) by mouth daily with breakfast. 30 tablet 3  . Prenatal Vit-Fe Fumarate-FA (MULTIVITAMIN-PRENATAL) 27-0.8 MG TABS tablet Take 1 tablet by mouth daily at 12 noon. 30 tablet 6    Review of Systems  Constitutional: Negative.  Negative for fatigue and fever.  HENT: Negative.   Respiratory: Negative.  Negative for shortness of breath.   Cardiovascular: Negative.  Negative for chest pain.  Gastrointestinal: Negative.  Negative for abdominal pain, constipation, diarrhea, nausea and vomiting.  Genitourinary: Negative.  Negative for dysuria, vaginal bleeding and vaginal discharge.  Neurological: Negative.  Negative for dizziness and headaches.    Physical Exam   Vitals:   10/19/19 2017  Weight: 93.3 kg  Height: _0  (1.549 m)    Physical Exam  Nursing note and vitals reviewed. Constitutional: She is oriented to person, place, and time. She appears well-developed and well-nourished. No distress.  HENT:  Head: Normocephalic.  Eyes: Pupils are equal, round, and reactive to light.  Cardiovascular: Normal rate, regular rhythm and normal heart sounds.  Respiratory: Effort normal and breath sounds normal. No respiratory distress.  GI: Soft. Bowel sounds are normal. She exhibits no distension. There is no abdominal tenderness.  Neurological: She is alert and oriented to  person, place, and time.  Skin: Skin is warm and dry.  Psychiatric: She has a normal mood and affect. Her behavior is normal. Judgment and thought content normal.    Dilation: 1 Effacement (%): 90 Presentation: Vertex Exam by:: Len Blalock, CNM  MAU Course   Procedure: Patient informed of R/B/A of procedure. NST was performed and was reactive prior to procedure. Procedure done to begin ripening of the cervix prior to admission for induction of labor. Appropriate time out taken. The patient was placed in the lithotomy position and a cervical exam was performed and a finger was used to guide the 37F foley balloon through the internal os of the cervix. Foley Balloon filled with 60cc of normal saline. Plug inserted into end of the foley. Foley placed on tension and taped to medical thigh. NST:  Baseline: 135 bpm, Variability: Good {> 6 bpm), Accelerations: Reactive and Decelerations: Absent there were no signs of tachysystole or hypertonus. All equipment was removed and accounted for. The patient tolerated the procedure well.   Assessment and Plan:   1. Encounter for induction of labor   2. [redacted] weeks gestation of pregnancy     S/p Outpatient placement of foley balloon catheter for cervical ripening. Induction of labor scheduled for tomorrow at 0630 am. Reassuring FHR tracing with no concerns at present. Warning signs given to patient to include return to MAU for heavy vaginal bleeding, rupture of membranes, painful uterine contractions q 5 mins or less, severe abdominal discomfort, decreased fetal movement.  Wende Mott, [place] 10/19/2019 8:33 PM

## 2019-10-20 ENCOUNTER — Other Ambulatory Visit: Payer: Self-pay

## 2019-10-20 ENCOUNTER — Inpatient Hospital Stay (HOSPITAL_COMMUNITY)
Admission: RE | Admit: 2019-10-20 | Discharge: 2019-10-24 | DRG: 786 | Disposition: A | Discharge status: Home / Self Care | Payer: Medicaid Other | Attending: Family Medicine | Admitting: Family Medicine

## 2019-10-20 ENCOUNTER — Inpatient Hospital Stay (HOSPITAL_COMMUNITY): Payer: Medicaid Other

## 2019-10-20 ENCOUNTER — Inpatient Hospital Stay (HOSPITAL_COMMUNITY): Payer: Medicaid Other | Admitting: Anesthesiology

## 2019-10-20 ENCOUNTER — Encounter (HOSPITAL_COMMUNITY): Payer: Self-pay | Admitting: Obstetrics & Gynecology

## 2019-10-20 DIAGNOSIS — O411230 Chorioamnionitis, third trimester, not applicable or unspecified: Secondary | ICD-10-CM | POA: Diagnosis present

## 2019-10-20 DIAGNOSIS — Z30017 Encounter for initial prescription of implantable subdermal contraceptive: Secondary | ICD-10-CM

## 2019-10-20 DIAGNOSIS — D6959 Other secondary thrombocytopenia: Secondary | ICD-10-CM | POA: Diagnosis present

## 2019-10-20 DIAGNOSIS — O99214 Obesity complicating childbirth: Secondary | ICD-10-CM | POA: Diagnosis present

## 2019-10-20 DIAGNOSIS — O9902 Anemia complicating childbirth: Secondary | ICD-10-CM | POA: Diagnosis present

## 2019-10-20 DIAGNOSIS — O99019 Anemia complicating pregnancy, unspecified trimester: Secondary | ICD-10-CM | POA: Diagnosis present

## 2019-10-20 DIAGNOSIS — O329XX0 Maternal care for malpresentation of fetus, unspecified, not applicable or unspecified: Secondary | ICD-10-CM | POA: Diagnosis not present

## 2019-10-20 DIAGNOSIS — Z3A39 39 weeks gestation of pregnancy: Secondary | ICD-10-CM

## 2019-10-20 DIAGNOSIS — Z3A38 38 weeks gestation of pregnancy: Secondary | ICD-10-CM | POA: Diagnosis not present

## 2019-10-20 DIAGNOSIS — D696 Thrombocytopenia, unspecified: Secondary | ICD-10-CM | POA: Diagnosis present

## 2019-10-20 DIAGNOSIS — D509 Iron deficiency anemia, unspecified: Secondary | ICD-10-CM | POA: Diagnosis present

## 2019-10-20 DIAGNOSIS — O9912 Other diseases of the blood and blood-forming organs and certain disorders involving the immune mechanism complicating childbirth: Secondary | ICD-10-CM | POA: Diagnosis present

## 2019-10-20 DIAGNOSIS — E669 Obesity, unspecified: Secondary | ICD-10-CM | POA: Diagnosis present

## 2019-10-20 DIAGNOSIS — D563 Thalassemia minor: Secondary | ICD-10-CM | POA: Diagnosis present

## 2019-10-20 DIAGNOSIS — Z3403 Encounter for supervision of normal first pregnancy, third trimester: Secondary | ICD-10-CM | POA: Diagnosis not present

## 2019-10-20 LAB — CBC
HCT: 30.1 % — ABNORMAL LOW (ref 36.0–46.0)
Hemoglobin: 9.9 g/dL — ABNORMAL LOW (ref 12.0–15.0)
MCH: 28.9 pg (ref 26.0–34.0)
MCHC: 32.9 g/dL (ref 30.0–36.0)
MCV: 87.8 fL (ref 80.0–100.0)
Platelets: 81 10*3/uL — ABNORMAL LOW (ref 150–400)
RBC: 3.43 MIL/uL — ABNORMAL LOW (ref 3.87–5.11)
RDW: 14.4 % (ref 11.5–15.5)
WBC: 7.8 10*3/uL (ref 4.0–10.5)
nRBC: 0 % (ref 0.0–0.2)

## 2019-10-20 LAB — RPR: RPR Ser Ql: NONREACTIVE

## 2019-10-20 MED ORDER — ACETAMINOPHEN 325 MG PO TABS
650.0000 mg | ORAL_TABLET | ORAL | Status: DC | PRN
  Administered 2019-10-21: 650 mg via ORAL
  Filled 2019-10-20: qty 2

## 2019-10-20 MED ORDER — LACTATED RINGERS IV SOLN: 500.0000 mL | Freq: Once | INTRAVENOUS | Status: DC

## 2019-10-20 MED ORDER — MISOPROSTOL 50MCG HALF TABLET
50.0000 ug | ORAL_TABLET | ORAL | Status: DC | PRN
  Administered 2019-10-20: 50 ug via BUCCAL

## 2019-10-20 MED ORDER — PHENYLEPHRINE 40 MCG/ML (10ML) SYRINGE FOR IV PUSH (FOR BLOOD PRESSURE SUPPORT)
80.0000 ug | PREFILLED_SYRINGE | INTRAVENOUS | Status: DC | PRN
  Administered 2019-10-21 (×2): 80 ug via INTRAVENOUS

## 2019-10-20 MED ORDER — OXYCODONE-ACETAMINOPHEN 5-325 MG PO TABS: 1.0000 | ORAL_TABLET | ORAL | Status: DC | PRN

## 2019-10-20 MED ORDER — PROMETHAZINE HCL 25 MG/ML IJ SOLN
12.5000 mg | Freq: Once | INTRAMUSCULAR | Status: AC
  Administered 2019-10-20: 12.5 mg via INTRAVENOUS
  Filled 2019-10-20: qty 1

## 2019-10-20 MED ORDER — OXYTOCIN 40 UNITS IN NORMAL SALINE INFUSION - SIMPLE MED: 2.5000 [IU]/h | INTRAVENOUS | Status: DC

## 2019-10-20 MED ORDER — LIDOCAINE HCL (PF) 1 % IJ SOLN: 30.0000 mL | INTRAMUSCULAR | Status: DC | PRN

## 2019-10-20 MED ORDER — FENTANYL-BUPIVACAINE-NACL 0.5-0.125-0.9 MG/250ML-% EP SOLN
12.0000 mL/h | EPIDURAL | Status: DC | PRN
  Administered 2019-10-21: 12 mL/h via EPIDURAL
  Filled 2019-10-20 (×2): qty 250

## 2019-10-20 MED ORDER — LIDOCAINE HCL (PF) 1 % IJ SOLN
INTRAMUSCULAR | Status: DC | PRN
  Administered 2019-10-20: 7 mL via EPIDURAL

## 2019-10-20 MED ORDER — TERBUTALINE SULFATE 1 MG/ML IJ SOLN: 0.2500 mg | Freq: Once | INTRAMUSCULAR | Status: DC | PRN

## 2019-10-20 MED ORDER — OXYCODONE-ACETAMINOPHEN 5-325 MG PO TABS: 2.0000 | ORAL_TABLET | ORAL | Status: DC | PRN

## 2019-10-20 MED ORDER — EPHEDRINE 5 MG/ML INJ: 10.0000 mg | INTRAVENOUS | Status: DC | PRN

## 2019-10-20 MED ORDER — DIPHENHYDRAMINE HCL 50 MG/ML IJ SOLN: 12.5000 mg | INTRAMUSCULAR | Status: DC | PRN

## 2019-10-20 MED ORDER — OXYTOCIN 40 UNITS IN NORMAL SALINE INFUSION - SIMPLE MED
1.0000 m[IU]/min | INTRAVENOUS | Status: DC
  Administered 2019-10-20: 2 m[IU]/min via INTRAVENOUS
  Administered 2019-10-21: 4 m[IU]/min via INTRAVENOUS
  Filled 2019-10-20 (×2): qty 1000

## 2019-10-20 MED ORDER — OXYTOCIN BOLUS FROM INFUSION: 500.0000 mL | Freq: Once | INTRAVENOUS | Status: DC

## 2019-10-20 MED ORDER — MISOPROSTOL 50MCG HALF TABLET
ORAL_TABLET | ORAL | Status: AC
  Filled 2019-10-20: qty 1

## 2019-10-20 MED ORDER — SOD CITRATE-CITRIC ACID 500-334 MG/5ML PO SOLN: 30.0000 mL | ORAL | Status: DC | PRN

## 2019-10-20 MED ORDER — PHENYLEPHRINE 40 MCG/ML (10ML) SYRINGE FOR IV PUSH (FOR BLOOD PRESSURE SUPPORT)
80.0000 ug | PREFILLED_SYRINGE | INTRAVENOUS | Status: DC | PRN
  Filled 2019-10-20: qty 10

## 2019-10-20 MED ORDER — SODIUM CHLORIDE (PF) 0.9 % IJ SOLN
INTRAMUSCULAR | Status: DC | PRN
  Administered 2019-10-20: 12 mL/h via EPIDURAL

## 2019-10-20 MED ORDER — LACTATED RINGERS IV SOLN: INTRAVENOUS | Status: DC

## 2019-10-20 MED ORDER — LACTATED RINGERS IV SOLN
500.0000 mL | INTRAVENOUS | Status: DC | PRN
  Administered 2019-10-20: 23:00:00 500 mL via INTRAVENOUS

## 2019-10-20 MED ORDER — ONDANSETRON HCL 4 MG/2ML IJ SOLN: 4.0000 mg | Freq: Four times a day (QID) | INTRAMUSCULAR | Status: DC | PRN

## 2019-10-20 MED ORDER — NALBUPHINE HCL 10 MG/ML IJ SOLN
10.0000 mg | Freq: Once | INTRAMUSCULAR | Status: AC
  Administered 2019-10-20: 10 mg via INTRAVENOUS
  Filled 2019-10-20: qty 1

## 2019-10-20 NOTE — Progress Notes (Signed)
LABOR PROGRESS NOTE  Heidi [name] is a 19 y.o. [name] at [redacted]w[redacted]d  admitted for IOL for gestational thrombocytopenia   Subjective: Patient doing well, coping well with contractions   Objective: BP 114/62   Pulse 82   Temp 99 F (37.2 C) (Oral)   Resp 16   Ht 5\' 1"  (1.549 m)   Wt 93 kg   LMP  (LMP Unknown)   SpO2 100%   BMI 38.73 kg/m  or  Vitals:   10/20/19 1028 10/20/19 1200 10/20/19 1219 10/20/19 1222  BP: 130/73  121/62 114/62  Pulse: 79  78 82  Resp: 16     Temp:   99 F (37.2 C)   TempSrc:   Oral   SpO2:  99%  100%  Weight:      Height:        Dilation: 5 Effacement (%): 60 Cervical Position: Posterior Station: -3 Presentation: Vertex Exam by:: [email or web address], RN FHT: baseline rate 135, moderate varibility, +accel, no decel Toco: irregular contractions   Labs: Lab Results  Component Value Date   WBC 7.8 10/20/2019   HGB 9.9 (L) 10/20/2019   HCT 30.1 (L) 10/20/2019   MCV 87.8 10/20/2019   PLT 81 (L) 10/20/2019    Patient Active Problem List   Diagnosis Date Noted  . Alpha thalassemia silent carrier 08/27/2019  . Thrombocytopenia (HCC) 08/02/2019  . Supervision of low-risk pregnancy 06/30/2019  . Obesity (BMI 30-39.9) 06/30/2019    Assessment / Plan: 19 y.o. [name] at [redacted]w[redacted]d here for IOL for gestational thrombocytopenia   Labor: s/p outpatient FB and one dose of cytotec on arrival to LD. Pitocin initiated 2x2.  Fetal Wellbeing:  Cat I  Pain Control:  Pain medication PRN, dry catheter for epidural placed  Anticipated MOD:  SVD  [redacted]w[redacted]d, CNM 10/20/2019, 12:43 PM

## 2019-10-20 NOTE — Anesthesia Preprocedure Evaluation (Addendum)
Anesthesia Evaluation  Patient identified by MRN, date of birth, ID band Patient awake    Reviewed: Allergy & Precautions, NPO status , Patient's Chart, lab work & pertinent test results  Airway Mallampati: II  TM Distance: >3 FB Neck ROM: Full    Dental no notable dental hx. (+) Teeth Intact   Pulmonary neg pulmonary ROS,    Pulmonary exam normal breath sounds clear to auscultation       Cardiovascular Exercise Tolerance: Good negative cardio ROS Normal cardiovascular exam Rhythm:Regular Rate:Normal     Neuro/Psych negative neurological ROS  negative psych ROS   GI/Hepatic negative GI ROS, Neg liver ROS,   Endo/Other  negative endocrine ROS  Renal/GU negative Renal ROS     Musculoskeletal   Abdominal (+) + obese,   Peds  Hematology  (+) Blood dyscrasia, ,   Anesthesia Other Findings   Reproductive/Obstetrics (+) Pregnancy                             Anesthesia Physical Anesthesia Plan  ASA: III  Anesthesia Plan: Epidural   Post-op Pain Management:    Induction:   PONV Risk Score and Plan:   Airway Management Planned:   Additional Equipment:   Intra-op Plan:   Post-operative Plan:   Informed Consent: I have reviewed the patients History and Physical, chart, labs and discussed the procedure including the risks, benefits and alternatives for the proposed anesthesia with the patient or authorized representative who has indicated his/her understanding and acceptance.       Plan Discussed with:   Anesthesia Plan Comments: (39 wk [name] w thrombocytopenia for LEA Dry cath)       Anesthesia Quick Evaluation

## 2019-10-20 NOTE — Progress Notes (Signed)
LABOR PROGRESS NOTE  ANALLELI [name] is a 19 y.o. [name] at [redacted]w[redacted]d  admitted for IOL for gestational thrombocytopenia   Subjective: Patient breathing through contractions, feels increase in pressure   Objective: BP 135/74   Pulse 77   Temp 98.5 F (36.9 C) (Oral)   Resp 18   Ht 5\' 1"  (1.549 m)   Wt 93 kg   LMP  (LMP Unknown)   SpO2 100%   BMI 38.73 kg/m  or  Vitals:   10/20/19 1349 10/20/19 1446 10/20/19 1520 10/20/19 1554  BP: 121/64 (!) 133/95 117/68 135/74  Pulse: 78 83 89 77  Resp: 20 18 18    Temp:    98.5 F (36.9 C)  TempSrc:    Oral  SpO2:      Weight:      Height:        AROM@1625 , clear fluid  Dilation: 5 Effacement (%): 70 Cervical Position: Middle Station: -2 Presentation: Vertex Exam by:: , CNM FHT: baseline rate 160, moderate varibility, +accel, no decel Toco: 2-3/moderate by palpation   Labs: Lab Results  Component Value Date   WBC 7.8 10/20/2019   HGB 9.9 (L) 10/20/2019   HCT 30.1 (L) 10/20/2019   MCV 87.8 10/20/2019   PLT 81 (L) 10/20/2019    Patient Active Problem List   Diagnosis Date Noted  . Iron deficiency anemia during pregnancy 10/20/2019  . Alpha thalassemia silent carrier 08/27/2019  . Thrombocytopenia (HCC) 08/02/2019  . Supervision of low-risk pregnancy 06/30/2019  . Obesity (BMI 30-39.9) 06/30/2019    Assessment / Plan: 19 y.o. [name] at [redacted]w[redacted]d here for IOL for gestational thrombocytopenia   Labor: AROM@1625 , currently on 31milli-unit/min of pitocin, continue to titrate pitocin until active labor  Fetal Wellbeing:  Cat I  Pain Control:  Patient declines pain medication at this time, has dry catheter for epidural in place  Anticipated MOD:  SVD  [redacted]w[redacted]d, CNM 10/20/2019, 4:45 PM

## 2019-10-20 NOTE — Progress Notes (Addendum)
Labor Progress Note Heidi [name] is a 19 y.o. [name] at [redacted]w[redacted]d presented for IOL for gestational thrombocytopenia  S: Increased pain. Patient now requesting epidural.   O:  BP 102/62   Pulse 77   Temp 98 F (36.7 C) (Oral)   Resp 16   Ht 5\' 1"  (1.549 m)   Wt 93 kg   LMP  (LMP Unknown)   SpO2 99%   BMI 38.73 kg/m   CVE: Dilation: 6 Effacement (%): 80 Cervical Position: Middle Station: -1, -2 Presentation: Vertex Exam by:: C Cornetto RN   A&P: 19 y.o. [name] [redacted]w[redacted]d presented for IOL for gestational thrombocytopenia #Labor: Anticipated mode of delivery: SVD. S/p Arom at 1625. Currently on pitocin.  #Pain: s/p Epidural #FWB: Cat 1, EFW 39th%ile #GBS negative  [redacted]w[redacted]d, DO 9:41 PM  Addendum: Patient with relatively same exam for several hours and ctx difficult to trace. IUPC place posteriorly, titrate Pit to adequate MVUs.  10:20 PM Calla Kicks, MD [facility] Family Medicine Fellow, [facility] for [facility], [facility]

## 2019-10-20 NOTE — H&P (Addendum)
OBSTETRIC ADMISSION HISTORY AND PHYSICAL  Heidi [name] is a 19 y.o. female [name] with IUP at 58w0dby 2nd trimester [place] for IOL for gestational thrombocytopenia. She reports +FMs, No LOF, no VB, no blurry vision, headaches or peripheral edema, and RUQ pain.  She plans on breast feeding. She requests Nexplanon for birth control. [facility]  Dating: By 24wk [place]--->  Estimated Date of Delivery: 10/27/19  Sono: 08/28/2019  '@[redacted]w[redacted]d'$ , CWD, normal anatomy, cephalic presentation, anterior placental lie, 1770g, 39% EFW  Prenatal History/Complications: gestational thrombocytopenia Alpha thalassemia silent carrier Anemia Maternal Obesity  Past Medical History: Past Medical History:  Diagnosis Date  . Medical history non-contributory     Past Surgical History: Past Surgical History:  Procedure Laterality Date  . NO PAST SURGERIES      Obstetrical History: OB History    Gravida  1   Para      Term      Preterm      AB      Living  0     SAB      TAB      Ectopic      Multiple      Live Births              Social History: Social History   Socioeconomic History  . Marital status: Single    Spouse name: Not on file  . Number of children: Not on file  . Years of education: Not on file  . Highest education level: Not on file  Occupational History  . Not on file  Tobacco Use  . Smoking status: Never Smoker  . Smokeless tobacco: Never Used  Substance and Sexual Activity  . Alcohol use: No  . Drug use: No  . Sexual activity: Yes    Birth control/protection: None  Other Topics Concern  . Not on file  Social History Narrative  . Not on file   Social Determinants of Health   Financial Resource Strain:   . Difficulty of Paying Living Expenses:   Food Insecurity: No Food Insecurity  . Worried About RCharity fundraiserin the Last Year: Never true  . Ran Out of Food in the Last Year: Never true  Transportation Needs:  No Transportation Needs  . Lack of Transportation (Medical): No  . Lack of Transportation (Non-Medical): No  Physical Activity:   . Days of Exercise per Week:   . Minutes of Exercise per Session:   Stress:   . Feeling of Stress :   Social Connections:   . Frequency of Communication with Friends and Family:   . Frequency of Social Gatherings with Friends and Family:   . Attends Religious Services:   . Active Member of Clubs or Organizations:   . Attends CArchivistMeetings:   .Marland KitchenMarital Status:     Family History: Family History  Problem Relation Age of Onset  . Diabetes Mother   . Hypertension Mother     Allergies: Allergies  Allergen Reactions  . Strawberry Extract     Medications Prior to Admission  Medication Sig Dispense Refill Last Dose  . Blood Pressure Monitoring (BLOOD PRESSURE KIT) DEVI 1 Device by Does not apply route as needed. 1 each 0   . docusate sodium (COLACE) 50 MG capsule Take 1 capsule (50 mg total) by mouth daily as needed for mild constipation. 30 capsule 3   . ferrous gluconate (FERGON) 324 MG  tablet Take 1 tablet (324 mg total) by mouth daily with breakfast. 30 tablet 3   . Prenatal Vit-Fe Fumarate-FA (MULTIVITAMIN-PRENATAL) 27-0.8 MG TABS tablet Take 1 tablet by mouth daily at 12 noon. 30 tablet 6      Review of Systems   All systems reviewed and negative except as stated in HPI  Temperature 98.7 F (37.1 C), temperature source Oral, height 5' 1"  (1.549 m), weight 93 kg. General appearance: alert, cooperative, appears stated age and moderately obese Lungs: normal effort Heart: regular rate  Abdomen: soft, non-tender; bowel sounds normal Pelvic: gravid uterus GU: No vaginal lesions  Extremities: Homans sign is negative, no sign of DVT Presentation: cephalic Fetal monitoringBaseline: 150 bpm, Variability: Good {> 6 bpm), Accelerations: Reactive and Decelerations: Absent Uterine activity: infrequent  Dilation: 5 Effacement (%):  60 Station: -3 Exam by:: Dr. Marice Potter   Prenatal labs: ABO, Rh: A/Positive/-- (01/05 1221) Antibody: Negative (01/05 1221) Rubella: 3.09 (01/05 1221) RPR: Non Reactive (02/02 0832)  HBsAg: Negative (01/05 1221)  HIV: Non Reactive (02/02 3584)  GBS: Negative/-- (04/08 1553)  2 hr Glucola WNL Genetic screening  Low risk Anatomy US normal  Prenatal Transfer Tool  Maternal Diabetes: No Genetic Screening: Normal Maternal Ultrasounds/Referrals: Normal Fetal Ultrasounds or other Referrals:  None Maternal Substance Abuse:  No Significant Maternal Medications:  None Significant Maternal Lab Results: Group B Strep negative  No results found for this or any previous visit (from the past 24 hour(s)).  Patient Active Problem List   Diagnosis Date Noted  . Alpha thalassemia silent carrier 08/27/2019  . Thrombocytopenia ([place]) 08/02/2019  . Supervision of low-risk pregnancy 06/30/2019  . Obesity (BMI 30-39.9) 06/30/2019    Assessment/Plan:  Heidi [name] is a 19 y.o. [name] at 33w0dhere for IOL for gestational thrombocytopenia.  #Labor: Vertex by exam. FB placed last night and able to tug out this morning. Will give one Cytotec, anticipate Pitocin thereafter. AROM PRN. Anticipate SVD. #Pain: Per patient request #FWB: Cat I; EFW: 3500g #ID:  GBS neg #MOF: Breast #MOC: Nexplanon #Circ:  Yes #gestational thrombocytopenia/Anemia of pregnancy: Plt 81, Hgb 9.8 on 4/22; seen by Heme and thought to be due to pregnancy. D/w anesthesia this morning, possible dry catheter pending AM labs.   CBarrington Ellison MD [facility] HospitalFamily Medicine Fellow, [facility] 10/20/2019, 7:53 AM

## 2019-10-20 NOTE — Anesthesia Procedure Notes (Signed)

## 2019-10-21 ENCOUNTER — Encounter (HOSPITAL_COMMUNITY)
Admission: RE | Disposition: A | Discharge status: Home / Self Care | Payer: Self-pay | Source: Home / Self Care | Attending: Family Medicine

## 2019-10-21 ENCOUNTER — Telehealth: Payer: Self-pay | Admitting: Hematology and Oncology

## 2019-10-21 ENCOUNTER — Encounter (HOSPITAL_COMMUNITY): Payer: Self-pay | Admitting: Obstetrics & Gynecology

## 2019-10-21 DIAGNOSIS — O411230 Chorioamnionitis, third trimester, not applicable or unspecified: Secondary | ICD-10-CM

## 2019-10-21 DIAGNOSIS — O329XX0 Maternal care for malpresentation of fetus, unspecified, not applicable or unspecified: Secondary | ICD-10-CM

## 2019-10-21 DIAGNOSIS — Z3A39 39 weeks gestation of pregnancy: Secondary | ICD-10-CM

## 2019-10-21 LAB — CBC
HCT: 32.8 % — ABNORMAL LOW (ref 36.0–46.0)
HCT: 28.4 % — ABNORMAL LOW (ref 36.0–46.0)
Hemoglobin: 10.7 g/dL — ABNORMAL LOW (ref 12.0–15.0)
Hemoglobin: 9.5 g/dL — ABNORMAL LOW (ref 12.0–15.0)
MCH: 28.5 pg (ref 26.0–34.0)
MCH: 28.9 pg (ref 26.0–34.0)
MCHC: 32.6 g/dL (ref 30.0–36.0)
MCHC: 33.5 g/dL (ref 30.0–36.0)
MCV: 87.2 fL (ref 80.0–100.0)
MCV: 86.3 fL (ref 80.0–100.0)
Platelets: 74 10*3/uL — ABNORMAL LOW (ref 150–400)
Platelets: 66 10*3/uL — ABNORMAL LOW (ref 150–400)
RBC: 3.76 MIL/uL — ABNORMAL LOW (ref 3.87–5.11)
RBC: 3.29 MIL/uL — ABNORMAL LOW (ref 3.87–5.11)
RDW: 14.4 % (ref 11.5–15.5)
RDW: 14.3 % (ref 11.5–15.5)
WBC: 10.7 10*3/uL — ABNORMAL HIGH (ref 4.0–10.5)
WBC: 7.9 10*3/uL (ref 4.0–10.5)
nRBC: 0 % (ref 0.0–0.2)
nRBC: 0 % (ref 0.0–0.2)

## 2019-10-21 LAB — PREPARE RBC (CROSSMATCH)

## 2019-10-21 SURGERY — Surgical Case
Anesthesia: Epidural

## 2019-10-21 MED ORDER — LACTATED RINGERS IV SOLN
INTRAVENOUS | Status: DC | PRN
Start: 2019-10-21 — End: 2019-10-21

## 2019-10-21 MED ORDER — DIPHENHYDRAMINE HCL 50 MG/ML IJ SOLN: 12.5000 mg | INTRAMUSCULAR | Status: DC | PRN

## 2019-10-21 MED ORDER — DIPHENHYDRAMINE HCL 25 MG PO CAPS: 25.0000 mg | ORAL_CAPSULE | Freq: Four times a day (QID) | ORAL | Status: DC | PRN

## 2019-10-21 MED ORDER — ACETAMINOPHEN 10 MG/ML IV SOLN
1000.0000 mg | Freq: Once | INTRAVENOUS | Status: DC | PRN
  Administered 2019-10-21: 1000 mg via INTRAVENOUS

## 2019-10-21 MED ORDER — NALOXONE HCL 0.4 MG/ML IJ SOLN: 0.4000 mg | INTRAMUSCULAR | Status: DC | PRN

## 2019-10-21 MED ORDER — ONDANSETRON HCL 4 MG/2ML IJ SOLN
INTRAMUSCULAR | Status: DC | PRN
  Administered 2019-10-21: 4 mg via INTRAVENOUS

## 2019-10-21 MED ORDER — OXYTOCIN 10 UNIT/ML IJ SOLN
INTRAMUSCULAR | Status: AC
  Filled 2019-10-21: qty 4

## 2019-10-21 MED ORDER — NALBUPHINE HCL 10 MG/ML IJ SOLN: 5.0000 mg | INTRAMUSCULAR | Status: DC | PRN

## 2019-10-21 MED ORDER — CEFAZOLIN SODIUM-DEXTROSE 2-4 GM/100ML-% IV SOLN
INTRAVENOUS | Status: AC
  Filled 2019-10-21: qty 100

## 2019-10-21 MED ORDER — SOD CITRATE-CITRIC ACID 500-334 MG/5ML PO SOLN: 30.0000 mL | ORAL | Status: DC

## 2019-10-21 MED ORDER — CEFAZOLIN SODIUM-DEXTROSE 2-3 GM-%(50ML) IV SOLR
INTRAVENOUS | Status: DC | PRN
  Administered 2019-10-21: 2 g via INTRAVENOUS

## 2019-10-21 MED ORDER — LIDOCAINE HCL 1 % IJ SOLN
0.0000 mL | Freq: Once | INTRAMUSCULAR | Status: AC | PRN
  Administered 2019-10-22: 20 mL via INTRADERMAL
  Filled 2019-10-21: qty 20

## 2019-10-21 MED ORDER — NALBUPHINE HCL 10 MG/ML IJ SOLN: 5.0000 mg | Freq: Once | INTRAMUSCULAR | Status: DC | PRN

## 2019-10-21 MED ORDER — FENTANYL CITRATE (PF) 100 MCG/2ML IJ SOLN: 25.0000 ug | INTRAMUSCULAR | Status: DC | PRN

## 2019-10-21 MED ORDER — WITCH HAZEL-GLYCERIN EX PADS: 1.0000 "application " | MEDICATED_PAD | CUTANEOUS | Status: DC | PRN

## 2019-10-21 MED ORDER — FENTANYL CITRATE (PF) 100 MCG/2ML IJ SOLN
INTRAMUSCULAR | Status: DC | PRN
  Administered 2019-10-21 (×2): 100 ug via EPIDURAL

## 2019-10-21 MED ORDER — ACETAMINOPHEN 500 MG PO TABS
1000.0000 mg | ORAL_TABLET | Freq: Four times a day (QID) | ORAL | Status: DC
  Administered 2019-10-22: 1000 mg via ORAL
  Filled 2019-10-21: qty 2

## 2019-10-21 MED ORDER — SODIUM CHLORIDE 0.9 % IV SOLN
INTRAVENOUS | Status: AC
  Filled 2019-10-21: qty 500

## 2019-10-21 MED ORDER — SENNOSIDES-DOCUSATE SODIUM 8.6-50 MG PO TABS
2.0000 | ORAL_TABLET | ORAL | Status: DC
  Administered 2019-10-22 – 2019-10-23 (×3): 2 via ORAL
  Filled 2019-10-21 (×3): qty 2

## 2019-10-21 MED ORDER — FENTANYL CITRATE (PF) 100 MCG/2ML IJ SOLN
INTRAMUSCULAR | Status: AC
  Filled 2019-10-21: qty 2

## 2019-10-21 MED ORDER — SODIUM CHLORIDE 0.9 % IV SOLN
2.0000 g | Freq: Four times a day (QID) | INTRAVENOUS | Status: AC
  Administered 2019-10-22: 2 g via INTRAVENOUS
  Filled 2019-10-21: qty 2

## 2019-10-21 MED ORDER — CEFAZOLIN SODIUM-DEXTROSE 2-4 GM/100ML-% IV SOLN: 2.0000 g | INTRAVENOUS | Status: DC

## 2019-10-21 MED ORDER — SODIUM CHLORIDE 0.9% FLUSH: 3.0000 mL | INTRAVENOUS | Status: DC | PRN

## 2019-10-21 MED ORDER — ETONOGESTREL 68 MG ~~LOC~~ IMPL
68.0000 mg | DRUG_IMPLANT | Freq: Once | SUBCUTANEOUS | Status: AC
  Administered 2019-10-22: 68 mg via SUBCUTANEOUS
  Filled 2019-10-21 (×2): qty 1

## 2019-10-21 MED ORDER — METHYLERGONOVINE MALEATE 0.2 MG/ML IJ SOLN
INTRAMUSCULAR | Status: DC | PRN
  Administered 2019-10-21: .2 mg via INTRAMUSCULAR

## 2019-10-21 MED ORDER — SIMETHICONE 80 MG PO CHEW: 80.0000 mg | CHEWABLE_TABLET | ORAL | Status: DC | PRN

## 2019-10-21 MED ORDER — SODIUM BICARBONATE 8.4 % IV SOLN
INTRAVENOUS | Status: DC | PRN
  Administered 2019-10-21: 5 mL via EPIDURAL
  Administered 2019-10-21: 10 mL via EPIDURAL
  Administered 2019-10-21: 5 mL via EPIDURAL

## 2019-10-21 MED ORDER — ACETAMINOPHEN 10 MG/ML IV SOLN
INTRAVENOUS | Status: AC
  Filled 2019-10-21: qty 100

## 2019-10-21 MED ORDER — TRANEXAMIC ACID-NACL 1000-0.7 MG/100ML-% IV SOLN
INTRAVENOUS | Status: DC | PRN
  Administered 2019-10-21: 1000 mg via INTRAVENOUS

## 2019-10-21 MED ORDER — SODIUM CHLORIDE 0.9 % IV SOLN: 500.0000 mg | INTRAVENOUS | Status: DC

## 2019-10-21 MED ORDER — DIBUCAINE (PERIANAL) 1 % EX OINT: 1.0000 "application " | TOPICAL_OINTMENT | CUTANEOUS | Status: DC | PRN

## 2019-10-21 MED ORDER — SODIUM CHLORIDE 0.9 % IR SOLN
Status: DC | PRN
  Administered 2019-10-21: 1

## 2019-10-21 MED ORDER — KETOROLAC TROMETHAMINE 30 MG/ML IJ SOLN: 30.0000 mg | Freq: Four times a day (QID) | INTRAMUSCULAR | Status: AC

## 2019-10-21 MED ORDER — SODIUM CHLORIDE 0.9 % IV SOLN: INTRAVENOUS | Status: DC | PRN

## 2019-10-21 MED ORDER — SODIUM CHLORIDE 0.9 % IV SOLN
INTRAVENOUS | Status: DC | PRN
  Administered 2019-10-21: 500 mg via INTRAVENOUS

## 2019-10-21 MED ORDER — SODIUM CHLORIDE 0.9% IV SOLUTION: Freq: Once | INTRAVENOUS | Status: DC

## 2019-10-21 MED ORDER — MORPHINE SULFATE (PF) 0.5 MG/ML IJ SOLN
INTRAMUSCULAR | Status: DC | PRN
  Administered 2019-10-21: 2 mg via EPIDURAL

## 2019-10-21 MED ORDER — ONDANSETRON HCL 4 MG/2ML IJ SOLN: 4.0000 mg | Freq: Once | INTRAMUSCULAR | Status: DC | PRN

## 2019-10-21 MED ORDER — MENTHOL 3 MG MT LOZG: 1.0000 | LOZENGE | OROMUCOSAL | Status: DC | PRN

## 2019-10-21 MED ORDER — OXYTOCIN 40 UNITS IN NORMAL SALINE INFUSION - SIMPLE MED
INTRAVENOUS | Status: DC | PRN
  Administered 2019-10-21: 40 [IU] via INTRAVENOUS

## 2019-10-21 MED ORDER — HYDROCODONE-ACETAMINOPHEN 5-325 MG PO TABS
1.0000 | ORAL_TABLET | ORAL | Status: DC | PRN
  Administered 2019-10-22: 1 via ORAL
  Filled 2019-10-21: qty 1

## 2019-10-21 MED ORDER — ENOXAPARIN SODIUM 60 MG/0.6ML ~~LOC~~ SOLN
50.0000 mg | SUBCUTANEOUS | Status: DC
  Administered 2019-10-22 – 2019-10-24 (×3): 50 mg via SUBCUTANEOUS
  Filled 2019-10-21 (×3): qty 0.6

## 2019-10-21 MED ORDER — SIMETHICONE 80 MG PO CHEW
80.0000 mg | CHEWABLE_TABLET | ORAL | Status: DC
  Administered 2019-10-22 – 2019-10-23 (×3): 80 mg via ORAL
  Filled 2019-10-21 (×3): qty 1

## 2019-10-21 MED ORDER — OXYTOCIN 40 UNITS IN NORMAL SALINE INFUSION - SIMPLE MED
INTRAVENOUS | Status: AC
  Filled 2019-10-21: qty 1000

## 2019-10-21 MED ORDER — SCOPOLAMINE 1 MG/3DAYS TD PT72
1.0000 | MEDICATED_PATCH | Freq: Once | TRANSDERMAL | Status: DC
  Administered 2019-10-21: 1.5 mg via TRANSDERMAL
  Filled 2019-10-21: qty 1

## 2019-10-21 MED ORDER — LACTATED RINGERS IV SOLN: INTRAVENOUS | Status: DC

## 2019-10-21 MED ORDER — OXYTOCIN 40 UNITS IN NORMAL SALINE INFUSION - SIMPLE MED: 2.5000 [IU]/h | INTRAVENOUS | Status: AC

## 2019-10-21 MED ORDER — ONDANSETRON HCL 4 MG/2ML IJ SOLN: 4.0000 mg | Freq: Three times a day (TID) | INTRAMUSCULAR | Status: DC | PRN

## 2019-10-21 MED ORDER — GENTAMICIN SULFATE 40 MG/ML IJ SOLN
5.0000 mg/kg | INTRAVENOUS | Status: DC
  Administered 2019-10-21: 330 mg via INTRAVENOUS
  Filled 2019-10-21: qty 8.25

## 2019-10-21 MED ORDER — SIMETHICONE 80 MG PO CHEW
80.0000 mg | CHEWABLE_TABLET | Freq: Three times a day (TID) | ORAL | Status: DC
  Administered 2019-10-22 – 2019-10-24 (×6): 80 mg via ORAL
  Filled 2019-10-21 (×7): qty 1

## 2019-10-21 MED ORDER — NALOXONE HCL 4 MG/10ML IJ SOLN
1.0000 ug/kg/h | INTRAVENOUS | Status: DC | PRN
  Filled 2019-10-21: qty 5

## 2019-10-21 MED ORDER — SODIUM CHLORIDE 0.9 % IV SOLN
2.0000 g | Freq: Four times a day (QID) | INTRAVENOUS | Status: DC
  Administered 2019-10-21 (×2): 2 g via INTRAVENOUS
  Filled 2019-10-21 (×4): qty 2000

## 2019-10-21 MED ORDER — IBUPROFEN 800 MG PO TABS
800.0000 mg | ORAL_TABLET | Freq: Three times a day (TID) | ORAL | Status: DC
  Filled 2019-10-21: qty 1

## 2019-10-21 MED ORDER — ONDANSETRON HCL 4 MG/2ML IJ SOLN
INTRAMUSCULAR | Status: AC
  Filled 2019-10-21: qty 2

## 2019-10-21 MED ORDER — TRANEXAMIC ACID-NACL 1000-0.7 MG/100ML-% IV SOLN
INTRAVENOUS | Status: AC
  Filled 2019-10-21: qty 100

## 2019-10-21 MED ORDER — MORPHINE SULFATE (PF) 0.5 MG/ML IJ SOLN
INTRAMUSCULAR | Status: AC
  Filled 2019-10-21: qty 10

## 2019-10-21 MED ORDER — COCONUT OIL OIL: 1.0000 "application " | TOPICAL_OIL | Status: DC | PRN

## 2019-10-21 MED ORDER — LIDOCAINE-EPINEPHRINE (PF) 2 %-1:200000 IJ SOLN
INTRAMUSCULAR | Status: DC | PRN
  Administered 2019-10-21: 10 mL

## 2019-10-21 MED ORDER — DIPHENHYDRAMINE HCL 25 MG PO CAPS: 25.0000 mg | ORAL_CAPSULE | ORAL | Status: DC | PRN

## 2019-10-21 MED ORDER — LIDOCAINE-EPINEPHRINE (PF) 2 %-1:200000 IJ SOLN
INTRAMUSCULAR | Status: AC
  Filled 2019-10-21: qty 10

## 2019-10-21 MED ORDER — SODIUM BICARBONATE 8.4 % IV SOLN
INTRAVENOUS | Status: AC
  Filled 2019-10-21: qty 50

## 2019-10-21 MED ORDER — PRENATAL MULTIVITAMIN CH
1.0000 | ORAL_TABLET | Freq: Every day | ORAL | Status: DC
  Administered 2019-10-22 – 2019-10-24 (×3): 1 via ORAL
  Filled 2019-10-21 (×3): qty 1

## 2019-10-21 SURGICAL SUPPLY — 36 items
BENZOIN TINCTURE PRP APPL 2/3 (GAUZE/BANDAGES/DRESSINGS) ×3 IMPLANT
CHLORAPREP W/TINT 26ML (MISCELLANEOUS) ×3 IMPLANT
CLAMP CORD UMBIL (MISCELLANEOUS) IMPLANT
CLOSURE WOUND 1/2 X4 (GAUZE/BANDAGES/DRESSINGS) ×1
CLOTH BEACON ORANGE TIMEOUT ST (SAFETY) ×3 IMPLANT
DRSG OPSITE POSTOP 4X10 (GAUZE/BANDAGES/DRESSINGS) ×3 IMPLANT
ELECT REM PT RETURN 9FT ADLT (ELECTROSURGICAL) ×3
ELECTRODE REM PT RTRN 9FT ADLT (ELECTROSURGICAL) ×1 IMPLANT
EXTRACTOR VACUUM M CUP 4 TUBE (SUCTIONS) IMPLANT
EXTRACTOR VACUUM M CUP 4' TUBE (SUCTIONS)
GLOVE BIOGEL PI IND STRL 7.0 (GLOVE) ×2 IMPLANT
GLOVE BIOGEL PI IND STRL 7.5 (GLOVE) ×2 IMPLANT
GLOVE BIOGEL PI INDICATOR 7.0 (GLOVE) ×4
GLOVE BIOGEL PI INDICATOR 7.5 (GLOVE) ×4
GLOVE ECLIPSE 7.5 STRL STRAW (GLOVE) ×3 IMPLANT
GOWN STRL REUS W/TWL LRG LVL3 (GOWN DISPOSABLE) ×9 IMPLANT
HEMOSTAT ARISTA ABSORB 3G PWDR (HEMOSTASIS) ×3 IMPLANT
KIT ABG SYR 3ML LUER SLIP (SYRINGE) IMPLANT
NEEDLE HYPO 25X5/8 SAFETYGLIDE (NEEDLE) IMPLANT
NS IRRIG 1000ML POUR BTL (IV SOLUTION) ×3 IMPLANT
PACK C SECTION WH (CUSTOM PROCEDURE TRAY) ×3 IMPLANT
PAD OB MATERNITY 4.3X12.25 (PERSONAL CARE ITEMS) ×3 IMPLANT
PENCIL SMOKE EVAC W/HOLSTER (ELECTROSURGICAL) ×3 IMPLANT
RTRCTR C-SECT PINK 25CM LRG (MISCELLANEOUS) ×3 IMPLANT
STRIP CLOSURE SKIN 1/2X4 (GAUZE/BANDAGES/DRESSINGS) ×2 IMPLANT
SUT PLAIN 2 0 XLH (SUTURE) ×3 IMPLANT
SUT VIC AB 0 CTX 36 (SUTURE) ×6
SUT VIC AB 0 CTX36XBRD ANBCTRL (SUTURE) ×3 IMPLANT
SUT VIC AB 2-0 CT1 27 (SUTURE) ×2
SUT VIC AB 2-0 CT1 TAPERPNT 27 (SUTURE) ×1 IMPLANT
SUT VIC AB 4-0 KS 27 (SUTURE) ×3 IMPLANT
SYR 3ML 25GX5/8 SAFETY (SYRINGE) ×3 IMPLANT
SYR CONTROL 10ML LL (SYRINGE) ×3 IMPLANT
TOWEL OR 17X24 6PK STRL BLUE (TOWEL DISPOSABLE) ×3 IMPLANT
TRAY FOLEY W/BAG SLVR 14FR LF (SET/KITS/TRAYS/PACK) ×3 IMPLANT
WATER STERILE IRR 1000ML POUR (IV SOLUTION) ×3 IMPLANT

## 2019-10-21 NOTE — Transfer of Care (Signed)
Immediate Anesthesia Transfer of Care Note  Patient: Heidi [name]  Procedure(s) Performed: CESAREAN SECTION (N/A )  Patient Location: PACU  Anesthesia Type:Epidural  Level of Consciousness: awake, alert  and oriented  Airway & Oxygen Therapy: Patient Spontanous Breathing  Post-op Assessment: Report given to RN and Post -op Vital signs reviewed and stable  Post vital signs: Reviewed  Last Vitals:  Vitals Value Taken Time  BP 144/70 10/21/19 1837  Temp 37 C 10/21/19 1839  Pulse 109 10/21/19 1839  Resp 14 10/21/19 1839  SpO2 99 % 10/21/19 1839  Vitals shown include unvalidated device data.  Last Pain:  Vitals:   10/21/19 1839  TempSrc: Oral  PainSc:       Patients Stated Pain Goal: 8 (10/21/19 0727)  Complications: No apparent anesthesia complications

## 2019-10-21 NOTE — Progress Notes (Signed)
Labor Progress Note KABAO LEITE is a 19 y.o. [name] at [redacted]w[redacted]d presented for IOL for gestational thrombocytopenia  S: Continues to feel pressure in abdomen.  O:  BP (!) 150/78   Pulse 98   Temp 99.2 F (37.3 C) (Oral)   Resp 17   Ht 5\' 1"  (1.549 m)   Wt 93 kg   LMP  (LMP Unknown)   SpO2 99%   BMI 38.73 kg/m   EFM: 150, minimal to moderate variability, no accels, no decels TOCO: q76m  CVE: Dilation: 7 Effacement (%): 90 Cervical Position: Middle Station: 0, -1 Presentation: Vertex Exam by:: Dr. [email or web address]   A&P: 19 y.o. [name] [redacted]w[redacted]d presented for IOL for gestational thrombocytopenia #Labor: S/p Cytotec, FB and AROM. IUPC in place and MVU's have been adequate for > 5 + hours. Very slow progress overnight. Baby is still OP and again attempted to manually rotate. Positioned in hands and knees and will then try sideline release. Anticipate SVD; CS as appropriate which was discussed with patient. Dr. [redacted]w[redacted]d updated. #Pain: Epidural #FWB: Cat II with occasional moderate variability and scalp stim present  #GBS negative  Jeanann Lewandowsky, MD 6:44 AM

## 2019-10-21 NOTE — Progress Notes (Signed)
ANTIBIOTIC CONSULT NOTE - INITIAL  Pharmacy Consult for Gentamicin Indication: Chorioamnionitis  Allergies  Allergen Reactions  . Strawberry Extract Nausea And Vomiting    Projectile vomiting    Patient Measurements: Height: 5\' 1"  (154.9 cm) Weight: 93 kg (205 lb) IBW/kg (Calculated) : 47.8 Adjusted Body Weight: 65.9 kg  Vital Signs: Temp: 100.8 F (38.2 C) (04/27 0756) Temp Source: Axillary (04/27 0756) BP: 124/75 (04/27 0801) Pulse Rate: 100 (04/27 0801)  Labs: Recent Labs    10/20/19 0738  WBC 7.8  HGB 9.9*  PLT 81*   No results for input(s): GENTTROUGH, GENTPEAK, GENTRANDOM in the last 72 hours.   Microbiology: Recent Results (from the past 720 hour(s))  Culture, beta strep (group b only)     Status: None   Collection Time: 10/02/19  3:53 PM   Specimen: Vaginal/Rectal; Genital   VR  Result Value Ref Range Status   Strep Gp B Culture Negative Negative Final    Comment: Centers for Disease Control and Prevention (CDC) and American Congress of Obstetricians and Gynecologists (ACOG) guidelines for prevention of perinatal group B streptococcal (GBS) disease specify co-collection of a vaginal and rectal swab specimen to maximize sensitivity of GBS detection. Per the CDC and ACOG, swabbing both the lower vagina and rectum substantially increases the yield of detection compared with sampling the vagina alone. Penicillin G, ampicillin, or cefazolin are indicated for intrapartum prophylaxis of perinatal GBS colonization. Reflex susceptibility testing should be performed prior to use of clindamycin only on GBS isolates from penicillin-allergic women who are considered a high risk for anaphylaxis. Treatment with vancomycin without additional testing is warranted if resistance to clindamycin is noted.   SARS CORONAVIRUS 2 (TAT 6-24 HRS) Nasopharyngeal Nasopharyngeal Swab     Status: None   Collection Time: 10/18/19  9:55 AM   Specimen: Nasopharyngeal Swab  Result  Value Ref Range Status   SARS Coronavirus 2 NEGATIVE NEGATIVE Final    Comment: (NOTE) SARS-CoV-2 target nucleic acids are NOT DETECTED. The SARS-CoV-2 RNA is generally detectable in upper and lower respiratory specimens during the acute phase of infection. Negative results do not preclude SARS-CoV-2 infection, do not rule out co-infections with other pathogens, and should not be used as the sole basis for treatment or other patient management decisions. Negative results must be combined with clinical observations, patient history, and epidemiological information. The expected result is Negative. Fact Sheet for Patients: 10/20/19 Fact Sheet for Healthcare Providers: HairSlick.no This test is not yet approved or cleared by the quierodirigir.com FDA and  has been authorized for detection and/or diagnosis of SARS-CoV-2 by FDA under an Emergency Use Authorization (EUA). This EUA will remain  in effect (meaning this test can be used) for the duration of the COVID-19 declaration under Section 56 4(b)(1) of the Act, 21 U.S.C. section 360bbb-3(b)(1), unless the authorization is terminated or revoked sooner. Performed at [facility], [address], [place], [place]     Medications:  Ampicillin 2 g Q6h   Plan:  Gentamicin 5 mg/kg (330 mg) IV every 24 hours Will check gentamicin levels if continued > 72hr or clinically indicated.  [id number], PharmD 10/21/2019,8:31 AM

## 2019-10-21 NOTE — Telephone Encounter (Signed)
Scheduled per 04/22 los, patient has been called and notified.  

## 2019-10-21 NOTE — Progress Notes (Signed)
Labor Progress Note Heidi [name] is a 19 y.o. [name] at [redacted]w[redacted]d presented for IOL for gestational thrombocytopenia  S: Feeling very tired   O:  BP 116/62   Pulse (!) 113   Temp (!) 101.9 F (38.8 C) (Axillary)   Resp 20   Ht 5\' 1"  (1.549 m)   Wt 93 kg   LMP  (LMP Unknown)   SpO2 99%   BMI 38.73 kg/m   EFM: 175 / moderate variability / no accels, no decels  TOCO: irregular every 5-7 minutes   CVE: Dilation: 7.5 Effacement (%): 90 Cervical Position: Middle Station: -1 Presentation: Vertex Exam by:: Dr. [email or web address]   A&P: 19 y.o. [name] [redacted]w[redacted]d presented for IOL for gestational thrombocytopenia #Labor: S/p Cytotec, FB and AROM. IUPC was placed. S/p pit break from 0830-1200, baby by SVE and [redacted]w[redacted]d examination is ROP. Attempted manual rotation, able to elevate baby out of pelvis but not to rotate, hopefully with L side lie, peanut ball, and restarting pitocin will get rotation and further descent. Recheck once she is in an adequate contraction pattern for 2h or at 4h mark.  #Pain: Epidural #FWB: Cat II, currently tachy but maternal fever, overall reassuring for moderate variability but will need to be watched closely and hopefully return to baseline once fever is gone #GBS: negative  #Triple I: Maternal fever >100.71F and two occasions >30 min apart, started on Amp and [place].    # gestational thrombocytopenia: Slight dip in platelets from 81>74, consider recheck in 12h if not yet delivered.   [name], MD 12:25 PM

## 2019-10-21 NOTE — Op Note (Signed)
Heidi [name] PROCEDURE DATE: 10/21/2019  PREOPERATIVE DIAGNOSIS: Intrauterine pregnancy at  [redacted]w[redacted]d weeks gestation; chorioamnionitis, failure to progress: arrest of dilation, malpresentation: ROP and gestational thrombocytopenia  POSTOPERATIVE DIAGNOSIS: The same  PROCEDURE: Primary Low Transverse Cesarean Section  SURGEON:  Dr. Candelaria Celeste  ASSISTANT: Dr. Mary Sella Crissie Reese  INDICATIONS: Heidi [name] is a 19 y.o. [name] at [redacted]w[redacted]d scheduled for cesarean section secondary to the indications listed above.  The risks of cesarean section discussed with the patient included but were not limited to: bleeding which may require transfusion or reoperation; infection which may require antibiotics; injury to bowel, bladder, ureters or other surrounding organs; injury to the fetus; need for additional procedures including hysterectomy in the event of a life-threatening hemorrhage; placental abnormalities wth subsequent pregnancies, incisional problems, thromboembolic phenomenon and other postoperative/anesthesia complications. The patient concurred with the proposed plan, giving informed written consent for the procedure.    FINDINGS:  Viable female infant in ROP presentation.  Apgars 8 and 9, weight 3615 grams. Clear amniotic fluid.  Intact placenta, three vessel cord.  Normal uterus, fallopian tubes and ovaries bilaterally.  ANESTHESIA: Epidural INTRAVENOUS FLUIDS: 1200 ml ESTIMATED BLOOD LOSS: 387 ml URINE OUTPUT:  300 ml SPECIMENS: Placenta sent to pathology COMPLICATIONS: None immediate  PROCEDURE IN DETAIL:  The patient received intravenous antibiotics and had sequential compression devices applied to her lower extremities while in the preoperative area.  She was then taken to the operating room where epidural anesthesia was dosed up to surgical level and was found to be adequate. She was then placed in a dorsal supine position with a leftward tilt, and prepped and draped in a sterile manner.  A foley catheter was placed into her bladder and attached to constant gravity, which drained clear fluid throughout.  After an adequate timeout was performed, a Pfannenstiel skin incision was made with scalpel and carried through to the underlying layer of fascia. The fascia was incised in the midline and this incision was extended bilaterally using the Mayo scissors. Kocher clamps were applied to the superior aspect of the fascial incision and the underlying rectus muscles were dissected off bluntly. A similar process was carried out on the inferior aspect of the facial incision. The rectus muscles were separated in the midline bluntly and the peritoneum was entered bluntly. An Alexis retractor was placed to aid in visualization of the uterus.  Attention was turned to the lower uterine segment where a transverse hysterotomy was made with a scalpel and extended bilaterally bluntly. The infant was successfully delivered, and cord was clamped and cut and infant was handed over to awaiting neonatology team. Uterine massage was then administered and the placenta delivered intact with three-vessel cord. The uterus was then cleared of clot and debris.  The hysterotomy was closed with 0 Monocryl in a running locked fashion, and an imbricating layer was also placed with a 0 Monocryl. Overall, excellent hemostasis was noted. The abdomen and the pelvis were cleared of all clot and debris and the Jon Gills was removed. Hemostasis was confirmed on all surfaces.  The peritoneum was reapproximated using 2-0 vicryl running stitches. The fascia was then closed using 0 Vicryl in a running fashion. The subcutaneous layer was reapproximated with plain gut and the skin was closed with 4-0 vicryl. The patient tolerated the procedure well. Sponge, lap, instrument and needle counts were correct x 2. She was taken to the recovery room in stable condition.    Venora Maples, MD 10/21/2019 6:33 PM

## 2019-10-21 NOTE — Discharge Summary (Signed)
Postpartum Discharge Summary     Patient Name: Heidi [name] DOB: Dec 25, 2000 MRN: [id number]  Date of admission: 10/20/2019 Delivering Provider: Truett Mainland   Date of discharge: 10/24/2019  Admitting diagnosis: Thrombocytopenia ([place]) [D69.6] Intrauterine pregnancy: [redacted]w[redacted]d    Secondary diagnosis:  Active Problems:   Obesity (BMI 30-39.9)   Thrombocytopenia (H[facility])   Alpha thalassemia silent carrier   Iron deficiency anemia during pregnancy   Cesarean delivery delivered   Encounter for initial prescription of implantable subdermal contraceptive  Additional problems: none     Discharge diagnosis: Term Pregnancy Delivered                                                                                                Post partum procedures:Nexplanon placed  Augmentation: AROM, Pitocin, Cytotec and Foley Balloon  Complications: Intrauterine Inflammation or infection (Chorioamniotis)  Hospital course:  Induction of Labor With Unscheduled Cesarean Delivery   19y.o. yo [name] at 334w1das4w1das admitted to the hospital 10/20/2019 for induction of labor.  Indication for induction: gestational thromboctyopenia at term.  Patient had an uncomplicated labor course as follows: induced with FB, misoprostol, pitocin, and AROM. Achieved 7cm and remained 7-8cm for 8 hours without significant cervical change and then developed NRFHT and confirmed to be ROP on bedside [place] which point she was taken for CS for arrest of dilation and NRFHT.  Membrane Rupture Time/Date: 4:27 PM ,10/20/2019   Intrapartum Procedures: Episiotomy:                                          Lacerations:    Patient had delivery of a Viable infant.  Information for the patient's newborn:  SuZaleigh, [place]0[[id number]]Delivery Method: C-Section, Low Transverse(Filed from Delivery Summary)    10/21/2019  Details of delivery can be found in separate delivery note.  Patient had a routine postpartum course. Patient is  discharged home 10/24/19. Delivery time: 5:40 PM    Magnesium Sulfate received: No BMZ received: No Rhophylac:N/A MMR:N/A Transfusion:No  Physical exam  Vitals:   10/23/19 0519 10/23/19 1355 10/23/19 2240 10/24/19 0512  BP: 112/61 121/67 115/75 121/62  Pulse: 91 85 98 (!) 106  Resp: 18 16  18   Temp: 97.9 F (36.6 C) 98.2 F (36.8 C) 98.7 F (37.1 C) 98.6 F (37 C)  TempSrc: Oral Oral Axillary Oral  SpO2: 99%  100% 99%  Weight:      Height:       General: alert, cooperative and no distress Lochia: appropriate Uterine Fundus: firm Incision: Healing well with no significant drainage, No significant erythema, Dressing is clean, dry, and intact DVT Evaluation: No evidence of DVT seen on physical exam. Negative Homan's sign. No cords or calf tenderness. No significant calf/ankle edema. Labs: Lab Results  Component Value Date   WBC 10.6 (H) 10/23/2019   HGB 7.4 (L) 10/23/2019   HCT 22.2 (L) 10/23/2019   MCV 86.0 10/23/2019   PLT 90 (L) 10/23/2019  CMP Latest Ref Rng & Units 10/16/2019  Glucose 70 - 99 mg/dL 120(H)  BUN 6 - 20 mg/dL 9  Creatinine 0.44 - 1.00 mg/dL 0.67  Sodium 135 - 145 mmol/L 136  Potassium 3.5 - 5.1 mmol/L 3.6  Chloride 98 - 111 mmol/L 109  CO2 22 - 32 mmol/L 21(L)  Calcium 8.9 - 10.3 mg/dL 8.6(L)  Total Protein 6.5 - 8.1 g/dL 6.1(L)  Total Bilirubin 0.3 - 1.2 mg/dL <0.2(L)  Alkaline Phos 38 - 126 U/L 136(H)  AST 15 - 41 U/L 20  ALT 0 - 44 U/L 14   Edinburgh Score: Edinburgh Postnatal Depression Scale Screening Tool 10/22/2019  I have been able to laugh and see the funny side of things. 0  I have looked forward with enjoyment to things. 0  I have blamed myself unnecessarily when things went wrong. 0  I have been anxious or worried for no good reason. 0  I have felt scared or panicky for no good reason. 0  Things have been getting on top of me. 0  I have been so unhappy that I have had difficulty sleeping. 0  I have felt sad or miserable. 0   I have been so unhappy that I have been crying. 0  The thought of harming myself has occurred to me. 0  Edinburgh Postnatal Depression Scale Total 0    Discharge instruction: per After Visit Summary and "Baby and Me Booklet".  After visit meds:  Allergies as of 10/24/2019      Reactions   Strawberry Extract Nausea And Vomiting   Projectile vomiting      Medication List    STOP taking these medications   docusate sodium 50 MG capsule Commonly known as: COLACE     TAKE these medications   acetaminophen 500 MG tablet Commonly known as: TYLENOL Take 2 tablets (1,000 mg total) by mouth every 6 (six) hours for 7 days.   Blood Pressure Kit Devi 1 Device by Does not apply route as needed.   ferrous gluconate 324 MG tablet Commonly known as: FERGON Take 1 tablet (324 mg total) by mouth daily with breakfast.   ibuprofen 800 MG tablet Commonly known as: ADVIL Take 1 tablet (800 mg total) by mouth every 8 (eight) hours.   multivitamin-prenatal 27-0.8 MG Tabs tablet Take 1 tablet by mouth daily at 12 noon.   oxyCODONE 5 MG immediate release tablet Commonly known as: Oxy IR/ROXICODONE Take 1 tablet (5 mg total) by mouth every 6 (six) hours as needed for up to 7 days for moderate pain or severe pain.       Diet: routine diet  Activity: Advance as tolerated. Pelvic rest for 6 weeks.   Outpatient follow up:6 weeks Follow up Appt: Future Appointments  Date Time Provider [place]  01/15/2020 10:30 AM [facility] None  01/15/2020 11:00 AM Orson Slick, MD [facility] None   Follow up Visit:   Please schedule this patient for Postpartum visit in: 6 weeks with the following provider: Any provider In-Person For C/S patients schedule nurse incision check in weeks 2 weeks: yes High risk pregnancy complicated by: gestational thrombocytopenia, triple I Delivery mode:  CS Anticipated Birth Control:  Nexplanon PP Procedures needed: Incision check,  repeat CBC Schedule Integrated BH visit: no   Newborn Data: Live born female  Birth Weight:  7 lbs 15.5 oz (3615 gm) APGAR: 8, 9  Newborn Delivery   Birth date/time: 10/21/2019 17:40:00 Delivery type: C-Section, Low  Transverse Trial of labor: Yes C-section categorization: Primary      Baby Feeding: Breast Disposition:home with mother   10/24/2019 Laury Deep, CNM

## 2019-10-21 NOTE — Progress Notes (Signed)
Labor Progress Note Heidi [name] is a 19 y.o. [name] at [redacted]w[redacted]d presented for IOL for gestational thrombocytopenia  S: Continues to feel pressure in abdomen otherwise comfortable.   O:  BP 128/79   Pulse 94   Temp 99.1 F (37.3 C) (Oral)   Resp 16   Ht 5\' 1"  (1.549 m)   Wt 93 kg   LMP  (LMP Unknown)   SpO2 99%   BMI 38.73 kg/m   EFM: 155, minimal to moderate variability, no accels, no decels TOCO: q2-105m  CVE: Dilation: 6 Effacement (%): 80 Cervical Position: Middle Station: -1, -2 Presentation: Vertex Exam by:: Dr. [email or web address]   A&P: 19 y.o. [name] [redacted]w[redacted]d presented for IOL for gestational thrombocytopenia #Labor: S/p Cytotec, FB and AROM. IUPC in place and MVU's have been adequate for > 4 hours with minimal cervical change. Baby is ROB; attempted to rotate. Will recheck in 2 hours. Cont Pit. Anticipate SVD. Anticipate SVD. #Pain: Epidural #FWB: Cat II with occasional moderate variability and scalp stim present  #GBS negative  [redacted]w[redacted]d, MD 4:09 AM

## 2019-10-21 NOTE — Progress Notes (Addendum)
Labor Progress Note Heidi [name] is a 19 y.o. [name] at [redacted]w[redacted]d presented for IOL for gestational thrombocytopenia  S: Patient tired and is resting.   O:  BP 132/65   Pulse 92   Temp (!) 100.8 F (38.2 C) (Axillary)   Resp 20   Ht 5\' 1"  (1.549 m)   Wt 93 kg   LMP  (LMP Unknown)   SpO2 99%   BMI 38.73 kg/m   EFM: 145 / fair variability / pos 10x10 accels, no decels  TOCO: every 2-3 minutes   CVE: Dilation: 7 Effacement (%): 90 Cervical Position: Middle Station: 0, -1 Presentation: Vertex Exam by:: Dr. [email or web address]   A&P: 19 y.o. [name] [redacted]w[redacted]d presented for IOL for gestational thrombocytopenia #Labor: S/p Cytotec, FB and AROM. IUPC was placed.  Will recheck in 2 hours. Will give 2-4 hour pitocin break. Continue positional changes in hopes the newborn rotates.  Anticipate vaginal delivery. Reassess in 2-3 hours.  #Pain: Epidural #FWB: Cat I-II #GBS negative  #Triple I:  Pt is febrile 100.91F. Started ampicillin and gentamicin.   # gestational thrombocytopenia: CBC pending.  Plts were stable on admission.   [name], DO 8:50 AM

## 2019-10-21 NOTE — Progress Notes (Signed)
Patient ID: Heidi [name], female   DOB: 02/11/2001, 19 y.o.   MRN: [id number]   Fetal position ROP, despite attempts at repositioning. Had prolonged bradycardia, followed by minimum variability. Will proceed to cesarean section.  The risks of cesarean section discussed with the patient included but were not limited to: bleeding which may require transfusion or reoperation; infection which may require antibiotics; injury to bowel, bladder, ureters or other surrounding organs; injury to the fetus; need for additional procedures including hysterectomy in the event of a life-threatening hemorrhage; placental abnormalities wth subsequent pregnancies, incisional problems, thromboembolic phenomenon and other postoperative/anesthesia complications. The patient concurred with the proposed plan, giving informed written consent for the procedure.   Patient has been NPO since last night, she will remain NPO for procedure. Anesthesia and OR aware.  Preoperative prophylactic Ancef and azithromycin ordered on call to the OR.  To OR when ready.  Levie Heritage, DO 10/21/2019 4:58 PM

## 2019-10-21 NOTE — Progress Notes (Signed)
Labor Progress Note Heidi [name] is a 19 y.o. [name] at [redacted]w[redacted]d presented for IOL for gestational thrombocytopenia  S: Feeling pressure in abdomen otherwise comfortable.   O:  BP (!) 111/55 (BP Location: Left Arm)   Pulse 77   Temp 99.2 F (37.3 C) (Oral)   Resp 17   Ht 5\' 1"  (1.549 m)   Wt 93 kg   LMP  (LMP Unknown)   SpO2 99%   BMI 38.73 kg/m   EFM: 150, moderate variability, pos accels, no decels, reactive TOCO: q48m  CVE: Dilation: 6 Effacement (%): 80 Cervical Position: Middle Station: -1, -2 Presentation: Vertex Exam by:: Dr. [email or web address]   A&P: 19 y.o. [name] [redacted]w[redacted]d presented for IOL for gestational thrombocytopenia #Labor: S/p Cytotec, FB and AROM. IUPC in place and MVU's have been adequate. Cont Pit. FHT had been Cat II and no change since last exam 2 hours ago despite adequate ctx, some molding present, unable to assess fetal position. Hands and knees currently to aid. Currently Cat I. Anticipate SVD. #Pain: s/p Epidural #FWB: Cat I currently, had been Cat II for minimal to moderate variability and no accels but improved with hands and knees position; fetal scalp stim present #GBS negative  [redacted]w[redacted]d, MD 12:35 AM

## 2019-10-22 ENCOUNTER — Encounter: Payer: Self-pay | Admitting: *Deleted

## 2019-10-22 DIAGNOSIS — O411230 Chorioamnionitis, third trimester, not applicable or unspecified: Secondary | ICD-10-CM | POA: Diagnosis not present

## 2019-10-22 DIAGNOSIS — Z30017 Encounter for initial prescription of implantable subdermal contraceptive: Secondary | ICD-10-CM

## 2019-10-22 LAB — CBC
HCT: 27.3 % — ABNORMAL LOW (ref 36.0–46.0)
Hemoglobin: 9 g/dL — ABNORMAL LOW (ref 12.0–15.0)
MCH: 29 pg (ref 26.0–34.0)
MCHC: 33 g/dL (ref 30.0–36.0)
MCV: 88.1 fL (ref 80.0–100.0)
Platelets: 66 10*3/uL — ABNORMAL LOW (ref 150–400)
RBC: 3.1 MIL/uL — ABNORMAL LOW (ref 3.87–5.11)
RDW: 14.4 % (ref 11.5–15.5)
WBC: 7.7 10*3/uL (ref 4.0–10.5)
nRBC: 0 % (ref 0.0–0.2)

## 2019-10-22 MED ORDER — OXYCODONE HCL 5 MG PO TABS
5.0000 mg | ORAL_TABLET | Freq: Four times a day (QID) | ORAL | Status: DC | PRN
  Administered 2019-10-22: 5 mg via ORAL
  Administered 2019-10-23 (×2): 10 mg via ORAL
  Administered 2019-10-24 (×2): 5 mg via ORAL
  Filled 2019-10-22: qty 1
  Filled 2019-10-22: qty 2
  Filled 2019-10-22 (×2): qty 1
  Filled 2019-10-22: qty 2

## 2019-10-22 MED ORDER — GABAPENTIN 300 MG PO CAPS
300.0000 mg | ORAL_CAPSULE | Freq: Three times a day (TID) | ORAL | Status: DC | PRN
  Administered 2019-10-22 – 2019-10-24 (×4): 300 mg via ORAL
  Filled 2019-10-22 (×5): qty 1

## 2019-10-22 MED ORDER — ACETAMINOPHEN 500 MG PO TABS
1000.0000 mg | ORAL_TABLET | Freq: Four times a day (QID) | ORAL | Status: DC
  Administered 2019-10-22 – 2019-10-24 (×7): 1000 mg via ORAL
  Filled 2019-10-22 (×9): qty 2

## 2019-10-22 MED ORDER — SODIUM CHLORIDE 0.9 % IV SOLN
510.0000 mg | Freq: Once | INTRAVENOUS | Status: AC
  Administered 2019-10-22: 510 mg via INTRAVENOUS
  Filled 2019-10-22: qty 17

## 2019-10-22 NOTE — Procedures (Signed)
Post-Placental Nexplanon Insertion Procedure Note  Patient was identified. Informed consent was signed, signed copy in chart. A time-out was performed.    The insertion site was identified 8-10 cm (3-4 inches) from the medial epicondyle of the humerus and 3-5 cm (1.25-2 inches) posterior to (below) the sulcus (groove) between the biceps and triceps muscles of the patient's left arm and marked. The site was prepped and draped in the usual sterile fashion. Pt was prepped with alcohol swab and then injected with 6 cc of 1% lidocaine. The site was prepped with betadine. Nexplanon removed form packaging,  Device confirmed in needle, then inserted full length of needle and withdrawn per handbook instructions. Provider and patient verified presence of the implant in the woman's arm by palpation. Pt insertion site was covered with steristrips/adhesive bandage and pressure bandage. There was minimal blood loss. Patient tolerated procedure well.  Patient was given post procedure instructions and Nexplanon user card with expiration date. Condoms were recommended for STI prevention. Patient was asked to keep the pressure dressing on for 24 hours to minimize bruising and keep the adhesive bandage on for 3-5 days. The patient verbalized understanding of the plan of care and agrees.   Lot # [id number] Expiration Date03/19/2023

## 2019-10-22 NOTE — Progress Notes (Addendum)
POSTPARTUM PROGRESS NOTE  Subjective: Heidi [name] is a 19 y.o. G1P1001 s/p LTCS at [redacted]w[redacted]d.  She reports she doing well. No acute events overnight. She denies any problems with ambulating, voiding or po intake. Denies nausea or vomiting. She has not passed flatus. Pain is well controlled.  Lochia is normal.  Objective: Blood pressure 115/74, pulse 77, temperature 98 F (36.7 C), resp. rate 20, height 5\' 1"  (1.549 m), weight 93 kg, SpO2 98 %, unknown if currently breastfeeding.  Physical Exam:  General: alert, cooperative and no distress Chest: no respiratory distress Abdomen: soft, non-tender, small amount of blood on bandages Uterine Fundus: firm, appropriately tender Extremities: No calf swelling or tenderness  No edema  Recent Labs    10/21/19 1925 10/22/19 0403  HGB 9.5* 9.0*  HCT 28.4* 27.3*    Assessment/Plan: Heidi [name] is a 19 y.o. G1P1001 s/p LTCS at [redacted]w[redacted]d for AoD.  Routine Postpartum Care: Doing well, pain well-controlled.  -- Continue routine care, lactation support  -- Contraception: Nexplanon inpatient -- Feeding: Breast  Dispo: Plan for discharge 4/29 AM.  5/29, DO, PGY1  I saw and evaluated the patient. I agree with the findings and the plan of care as documented in the resident's note. Will give IV iron for anemia. Plan for Nexplanon placement later today.  Heidi Pea, MD [facility] Family Medicine Fellow, [facility] for [facility]

## 2019-10-22 NOTE — Lactation Note (Signed)
This note was copied from a baby's chart. Lactation Consultation Note  Patient Name: Heidi [name] [id number] Date: 10/22/2019 Reason for consult: Initial assessment;Other (Comment);Term;Hyperbilirubinemia(mom is 19 y.o)  Visited with mom of a 53 hours old FT female, mom is a P1, she's a 19 y.o. She participated in the [facility] program and she's already familiar with hand expression, Maralyn Sago, her RN showed her how to and also assisted her with spoon feeding earlier today. She reported (+) breast changes during the pregnancy, when [facility] assisted mom with hand expression, colostrum was easily obtained, LC finger fed baby. Noticed that baby is sucking his own tongue and won't open this mouth, LC had to insert gloved finger to assess sucking and baby would spit it out or either clamp/chomp at gloved finger.  Mom was coming back from walking on the hall when entering the room, [facility] had to wait for RN to be done with mom's IV, then lab tech to do the 24 hours screen and finally for mom to sit down at her bed, she had a c-section and required a lot of assistance. Baby also had a circumcision this afternoon and was very sleepy, he had to be woken up for his feeding per family request.  LC took baby STS to mother's right breast in football position but he would not latch, or open his mouth, he kept sucking on his tongue. When LC tried doing more suck training baby has a medium emesis, mom told LC he's been spitty today. Tried again after burping baby, but got the same results, baby would not latch on even when he was awake. Asked mom to call for assistance when needed and documented attempt on flowsheets.  Reviewed normal newborn behavior, hyperbilirubinemia, cluster feeding, feeding cues and pumping schedule. LC also made mom aware about donor milk in case supplementation is necessary, baby's bilirubin is at 12.9; still awaiting serum bilirrubin that was taken with the 24 hours screen. LC set up a DEBP, instructions,  cleaning and storage were reviewed, as well as milk storage guidelines.   Feeding plan:  1. Encouraged mom to feed baby 8-12 times/24 hours or sooner if feeding cues are present 2. She'll pump every 3 hours after feedings and will offer baby any amount of EBM she may get 3. Hand expression and spoon/finger feeding were also encouraged  BF brochure, BF resources and feeding diary were reviewed. Dad and GOB were mom's support people and they're very involved during [facility] consultation. Family reported all questions and concerns were answered, they're all aware of LC OP services and will call PRN.   Maternal Data Formula Feeding for Exclusion: No Has patient been taught Hand Expression?: Yes Does the patient have breastfeeding experience prior to this delivery?: No  Feeding Feeding Type: Breast Fed  LATCH Score                   Interventions Interventions: Breast feeding basics reviewed;Breast massage;Hand express;Breast compression;DEBP;Hand pump;Assisted with latch;Skin to skin;Adjust position;Support pillows  Lactation Tools Discussed/Used Tools: Pump Breast pump type: Double-Electric Breast Pump;Manual WIC Program: Yes Pump Review: Setup, frequency, and cleaning;Milk Storage Initiated by:: MPeck Date initiated:: 10/22/19   Consult Status Consult Status: Follow-up Date: 10/23/19 Follow-up type: In-patient    Bubba Vanbenschoten Venetia Constable 10/22/2019, 7:33 PM

## 2019-10-23 ENCOUNTER — Encounter: Payer: Self-pay | Admitting: Anesthesiology

## 2019-10-23 LAB — CBC
HCT: 22.2 % — ABNORMAL LOW (ref 36.0–46.0)
Hemoglobin: 7.4 g/dL — ABNORMAL LOW (ref 12.0–15.0)
MCH: 28.7 pg (ref 26.0–34.0)
MCHC: 33.3 g/dL (ref 30.0–36.0)
MCV: 86 fL (ref 80.0–100.0)
Platelets: 90 10*3/uL — ABNORMAL LOW (ref 150–400)
RBC: 2.58 MIL/uL — ABNORMAL LOW (ref 3.87–5.11)
RDW: 14.4 % (ref 11.5–15.5)
WBC: 10.6 10*3/uL — ABNORMAL HIGH (ref 4.0–10.5)
nRBC: 0 % (ref 0.0–0.2)

## 2019-10-23 MED ORDER — FERROUS SULFATE 325 (65 FE) MG PO TABS
325.0000 mg | ORAL_TABLET | Freq: Every day | ORAL | Status: DC
  Administered 2019-10-23 – 2019-10-24 (×2): 325 mg via ORAL
  Filled 2019-10-23 (×2): qty 1

## 2019-10-23 NOTE — Progress Notes (Signed)
POSTPARTUM PROGRESS NOTE  Subjective: Heidi [name] is a 19 y.o. G1P1001 s/p LTCS at [redacted]w[redacted]d.  She reports she doing well. No acute events overnight. She denies any problems with ambulating, voiding or po intake. Denies nausea or vomiting. She has passed flatus. Pain is well controlled.  Lochia is normal. Denies dizziness or lightheadedness. Desires to discharge tomorrow. Reports breastfeeding has been difficult and she has had to use formula.   Objective: Blood pressure 112/61, pulse 91, temperature 97.9 F (36.6 C), temperature source Oral, resp. rate 18, height 5\' 1"  (1.549 m), weight 93 kg, SpO2 99 %, unknown if currently breastfeeding.  Physical Exam:  General: alert, cooperative and no distress Chest: no respiratory distress Incision: Clean pressure dressing in place Uterine Fundus: firm Extremities: No calf swelling or tenderness   Recent Labs    10/22/19 0403 10/23/19 0524  HGB 9.0* 7.4*  HCT 27.3* 22.2*    Assessment/Plan: Heidi [name] is a 19 y.o. G1P1001 s/p LTCS at [redacted]w[redacted]d for AoD.  Routine Postpartum Care: Doing well, pain well-controlled.  -- Continue routine care, lactation support  -- Contraception: Nexplanon placed -- Feeding: Breast -- Anemia (s/p) IV iron; cont with PO iron  Dispo: Plan for discharge 4/30 per patient request. Okay to discharge today if baby can; RN to page team for orders.  5/30, MD [facility] Family Medicine Fellow, [facility] for [facility]

## 2019-10-23 NOTE — Addendum Note (Signed)
Addendum  created 10/23/19 0750 by Elmer Picker, MD   Intraprocedure Staff edited

## 2019-10-23 NOTE — Anesthesia Postprocedure Evaluation (Signed)
Anesthesia Post Note  Patient: SAVANNAH MORFORD  Procedure(s) Performed: CESAREAN SECTION (N/A )     Patient location during evaluation: PACU Anesthesia Type: Epidural Level of consciousness: oriented and awake and alert Pain management: pain level controlled Vital Signs Assessment: post-procedure vital signs reviewed and stable Respiratory status: spontaneous breathing, respiratory function stable and patient connected to nasal cannula oxygen Cardiovascular status: blood pressure returned to baseline and stable Postop Assessment: no headache, no backache, no apparent nausea or vomiting and epidural receding Anesthetic complications: no    Last Vitals:  Vitals:   10/22/19 2105 10/23/19 0519  BP: 122/69 112/61  Pulse: 85 91  Resp: 16 18  Temp: 36.8 C 36.6 C  SpO2: 99% 99%    Last Pain:  Vitals:   10/23/19 0519  TempSrc: Oral  PainSc:                  Keagan Brislin L Ayanni Tun

## 2019-10-23 NOTE — Addendum Note (Signed)
Addendum  created 10/23/19 0747 by Elmer Picker, MD   Intraprocedure Event edited, Intraprocedure Staff edited

## 2019-10-23 NOTE — Lactation Note (Signed)
This note was copied from a baby's chart. Lactation Consultation Note  Patient Name: Boy Smita Lesh [id number] Date: 10/23/2019 Reason for consult: Follow-up assessment  P1 mother whose infant is now 65 hours old.  This is a term baby at 39+1 weeks.    Baby was asleep when I arrived.  Mother worked with a LC last night and mother has been primarily giving formula today.  Offered to assist with latching when baby is ready to feed again.    Educated mother on breast feeding basics.  Showed her the supplementation guideline sheet.  Stressed the importance of increasing his volume amounts according to hours of life.  Mother verbalized understanding and will give more volume.  Grandmother in room along with parents and stated that he feeds well with the bottle.  Suggested mother use her DEBP if she formula feeds only and baby does not stimulate her breasts.  Discussed breast stimulation as it relates to increasing milk volume.  Reviewed milk coming to volume and how to best obtain a full supply.  Mother does not have a DEBP for home use.  She is a [facility] participant in [employer] and has already signed up to [employer] the [employer] using [employer].  She stated she will have to buy a pump if desired.  Informed her of the gift shop rental program, but encouraged to buy her own if she sincerely desires to breast feed or provide breast milk.  Encouraged mother to call her RN/LC for latch assistance as needed.  RN updated.   Maternal Data    Feeding    LATCH Score                   Interventions    Lactation Tools Discussed/Used     Consult Status Consult Status: Follow-up Date: 10/24/19 Follow-up type: In-patient    Dora Sims 10/23/2019, 2:31 PM

## 2019-10-24 LAB — BPAM RBC
Blood Product Expiration Date: 202105252359
Blood Product Expiration Date: 202105252359
Unit Type and Rh: 6200
Unit Type and Rh: 6200

## 2019-10-24 LAB — TYPE AND SCREEN
ABO/RH(D): A POS
Antibody Screen: NEGATIVE
Unit division: 0
Unit division: 0

## 2019-10-24 LAB — SURGICAL PATHOLOGY

## 2019-10-24 MED ORDER — OXYCODONE HCL 5 MG PO TABS: 5.0000 mg | ORAL_TABLET | Freq: Four times a day (QID) | ORAL | 0 refills | Status: AC | PRN

## 2019-10-24 MED ORDER — ACETAMINOPHEN 500 MG PO TABS: 1000.0000 mg | ORAL_TABLET | Freq: Four times a day (QID) | ORAL | 0 refills | Status: AC

## 2019-10-24 MED ORDER — IBUPROFEN 800 MG PO TABS: 800.0000 mg | ORAL_TABLET | Freq: Three times a day (TID) | ORAL | 0 refills | Status: DC

## 2019-11-03 DIAGNOSIS — Z029 Encounter for administrative examinations, unspecified: Secondary | ICD-10-CM

## 2019-11-04 ENCOUNTER — Ambulatory Visit (INDEPENDENT_AMBULATORY_CARE_PROVIDER_SITE_OTHER): Payer: Medicaid Other | Admitting: *Deleted

## 2019-11-04 ENCOUNTER — Other Ambulatory Visit: Payer: Self-pay

## 2019-11-04 VITALS — BP 122/72 | HR 63

## 2019-11-04 DIAGNOSIS — Z5189 Encounter for other specified aftercare: Secondary | ICD-10-CM

## 2019-11-04 NOTE — Progress Notes (Signed)
Here for wound check. S/p c/s 4/27 for FTP/NRFHR.  Wound intact with steristrips. tanish discharge noted on right side of incision, streristrips damp slightly with scant serous discharge.  Removed one steristrip and noted wound not completely intact, pink edges noted.Asked Dr.Davis to check wound. Dr. Earlene Plater checked wound and found no issues. Reviewed with patient to keep clean , and dry and check wound each day. Instructed to get it checked if any issues such as redness, edema, discharge, opening, etc. Instructed to keep postpartum appointment. She voices understanding.  Heidi [name]

## 2019-11-04 NOTE — Progress Notes (Signed)
I have reviewed this chart and agree with the RN/CMA assessment and management.   I saw this patient with RN, wound appears intact except for very small 1 mm separation at left aspect, overall appears healthy and well approximated. Appropriately tender, no edema, no oozing, no redness. No concern for infection, recommended to keep dry and clean.   Baldemar Lenis, M.D. Attending Center for [employer] [facility])

## 2019-11-28 ENCOUNTER — Ambulatory Visit (INDEPENDENT_AMBULATORY_CARE_PROVIDER_SITE_OTHER): Payer: Medicaid Other | Admitting: Medical

## 2019-11-28 ENCOUNTER — Encounter: Payer: Self-pay | Admitting: Medical

## 2019-11-28 ENCOUNTER — Other Ambulatory Visit: Payer: Self-pay

## 2019-11-28 DIAGNOSIS — D509 Iron deficiency anemia, unspecified: Secondary | ICD-10-CM

## 2019-11-28 DIAGNOSIS — Z1389 Encounter for screening for other disorder: Secondary | ICD-10-CM

## 2019-11-28 DIAGNOSIS — O99019 Anemia complicating pregnancy, unspecified trimester: Secondary | ICD-10-CM

## 2019-11-28 DIAGNOSIS — D696 Thrombocytopenia, unspecified: Secondary | ICD-10-CM

## 2019-11-28 DIAGNOSIS — O9903 Anemia complicating the puerperium: Secondary | ICD-10-CM

## 2019-11-28 LAB — CBC
Hematocrit: 37.9 % (ref 34.0–46.6)
Hemoglobin: 12.3 g/dL (ref 11.1–15.9)
MCH: 28.2 pg (ref 26.6–33.0)
MCHC: 32.5 g/dL (ref 31.5–35.7)
MCV: 87 fL (ref 79–97)
Platelets: 153 10*3/uL (ref 150–450)
RBC: 4.36 x10E6/uL (ref 3.77–5.28)
RDW: 13.7 % (ref 11.7–15.4)
WBC: 7 10*3/uL (ref 3.4–10.8)

## 2019-11-28 NOTE — Progress Notes (Signed)
    Post Partum Visit Note  Heidi [name] is a 19 y.o. G71P1001 female who presents for a postpartum visit. She is 5 weeks 3 days postpartum following a primary cesarean section for arrest of decent.  I have fully reviewed the prenatal and intrapartum course. The delivery was at 39w 1d.  Anesthesia: epidural. Postpartum course has been normal. Baby is doing well. Baby is feeding by bottle - [employer] Start. Bleeding no bleeding. Bowel function is normal. Bladder function is normal. Patient is not sexually active. Contraception method is Nexplanon. Postpartum depression screening: negative.  The following portions of the patient's history were reviewed and updated as appropriate: allergies, current medications, past family history, past medical history, past social history, past surgical history and problem list.  Review of Systems Pertinent items are noted in HPI.    Objective:  Blood pressure 105/68, pulse 76, weight 163 lb 6.4 oz (74.1 kg), not currently breastfeeding.  General:  alert and cooperative   Breasts:  deferred, not breastfeeding  Lungs: clear to auscultation bilaterally  Heart:  regular rate and rhythm, S1, S2 normal, no murmur, click, rub or gallop  Abdomen: soft, non-tender; bowel sounds normal; no masses,  no organomegaly and incision is well-healed, no edema, erythema or drainage noted   Vulva:  not evaluated  Vagina: not evaluated  Cervix:  not evaluated  Corpus: not examined  Adnexa:  not evaluated  Rectal Exam: Not performed.        Assessment:    Normal postpartum exam.  Thrombocytopenia   Plan:   Essential components of care per ACOG recommendations:  1.  Mood and well being: Patient with negative depression screening today. Reviewed local resources for support.  - Patient does not use tobacco.  - hx of drug use? No    2. Infant care and feeding:  -Patient currently breastmilk feeding? No  -Social determinants of health (SDOH) reviewed in EPIC. No  concerns  3. Sexuality, contraception and birth spacing - Patient does not want a pregnancy in the next year.  Desired family size is 4 children.  - Reviewed forms of contraception in tiered fashion. Patient had Nexplanon placed PP.  - Discussed birth spacing of 18 months  4. Sleep and fatigue -Encouraged family/partner/community support of 4 hrs of uninterrupted sleep to help with mood and fatigue  5. Physical Recovery  - Discussed patients delivery and complications - Patient has urinary incontinence? No  - Patient is not safe to resume physical and sexual activity. Recommended 8 weeks of recovery time post C/S  6. Thrombocytopenia - CBC today   Vonzella Nipple, PA-C Center for [employer], [facility]

## 2019-11-28 NOTE — Patient Instructions (Signed)
Etonogestrel implant What is this medicine? ETONOGESTREL (et oh noe JES trel) is a contraceptive (birth control) device. It is used to prevent pregnancy. It can be used for up to 3 years. This medicine may be used for other purposes; ask your health care provider or pharmacist if you have questions. COMMON BRAND NAME(S): Implanon, Nexplanon What should I tell my health care provider before I take this medicine? They need to know if you have any of these conditions:  abnormal vaginal bleeding  blood vessel disease or blood clots  breast, cervical, endometrial, ovarian, liver, or uterine cancer  diabetes  gallbladder disease  heart disease or recent heart attack  high blood pressure  high cholesterol or triglycerides  kidney disease  liver disease  migraine headaches  seizures  stroke  tobacco smoker  an unusual or allergic reaction to etonogestrel, anesthetics or antiseptics, other medicines, foods, dyes, or preservatives  pregnant or trying to get pregnant  breast-feeding How should I use this medicine? This device is inserted just under the skin on the inner side of your upper arm by a health care professional. Talk to your pediatrician regarding the use of this medicine in children. Special care may be needed. Overdosage: If you think you have taken too much of this medicine contact a poison control center or emergency room at once. NOTE: This medicine is only for you. Do not share this medicine with others. What if I miss a dose? This does not apply. What may interact with this medicine? Do not take this medicine with any of the following medications:  amprenavir  fosamprenavir This medicine may also interact with the following medications:  acitretin  aprepitant  armodafinil  bexarotene  bosentan  carbamazepine  certain medicines for fungal infections like fluconazole, ketoconazole, itraconazole and voriconazole  certain medicines to treat  hepatitis, HIV or AIDS  cyclosporine  felbamate  griseofulvin  lamotrigine  modafinil  oxcarbazepine  phenobarbital  phenytoin  primidone  rifabutin  rifampin  rifapentine  St. John's wort  topiramate This list may not describe all possible interactions. Give your health care provider a list of all the medicines, herbs, non-prescription drugs, or dietary supplements you use. Also tell them if you smoke, drink alcohol, or use illegal drugs. Some items may interact with your medicine. What should I watch for while using this medicine? This product does not protect you against HIV infection (AIDS) or other sexually transmitted diseases. You should be able to feel the implant by pressing your fingertips over the skin where it was inserted. Contact your doctor if you cannot feel the implant, and use a non-hormonal birth control method (such as condoms) until your doctor confirms that the implant is in place. Contact your doctor if you think that the implant may have broken or become bent while in your arm. You will receive a user card from your health care provider after the implant is inserted. The card is a record of the location of the implant in your upper arm and when it should be removed. Keep this card with your health records. What side effects may I notice from receiving this medicine? Side effects that you should report to your doctor or health care professional as soon as possible:  allergic reactions like skin rash, itching or hives, swelling of the face, lips, or tongue  breast lumps, breast tissue changes, or discharge  breathing problems  changes in emotions or moods  coughing up blood  if you feel that the implant   may have broken or bent while in your arm  high blood pressure  pain, irritation, swelling, or bruising at the insertion site  scar at site of insertion  signs of infection at the insertion site such as fever, and skin redness, pain or  discharge  signs and symptoms of a blood clot such as breathing problems; changes in vision; chest pain; severe, sudden headache; pain, swelling, warmth in the leg; trouble speaking; sudden numbness or weakness of the face, arm or leg  signs and symptoms of liver injury like dark yellow or brown urine; general ill feeling or flu-like symptoms; light-colored stools; loss of appetite; nausea; right upper belly pain; unusually weak or tired; yellowing of the eyes or skin  unusual vaginal bleeding, discharge Side effects that usually do not require medical attention (report to your doctor or health care professional if they continue or are bothersome):  acne  breast pain or tenderness  headache  irregular menstrual bleeding  nausea This list may not describe all possible side effects. Call your doctor for medical advice about side effects. You may report side effects to FDA at 1-800-FDA-1088. Where should I keep my medicine? This drug is given in a hospital or clinic and will not be stored at home. NOTE: This sheet is a summary. It may not cover all possible information. If you have questions about this medicine, talk to your doctor, pharmacist, or health care provider.  2020 Elsevier/Gold Standard (2019-03-25 11:33:04)  

## 2019-12-16 ENCOUNTER — Other Ambulatory Visit: Payer: Self-pay | Admitting: Student

## 2019-12-16 MED ORDER — SERTRALINE HCL 25 MG PO TABS
25.0000 mg | ORAL_TABLET | Freq: Every day | ORAL | 6 refills | Status: DC
Start: 2019-12-16 — End: 2022-06-23

## 2019-12-16 NOTE — Progress Notes (Unsigned)
zoloft

## 2020-01-14 ENCOUNTER — Other Ambulatory Visit: Payer: Self-pay | Admitting: Hematology and Oncology

## 2020-01-14 DIAGNOSIS — D696 Thrombocytopenia, unspecified: Secondary | ICD-10-CM

## 2020-01-14 DIAGNOSIS — D500 Iron deficiency anemia secondary to blood loss (chronic): Secondary | ICD-10-CM

## 2020-01-14 NOTE — Progress Notes (Signed)
[place] [phone]   [phone]  PROGRESS NOTE  Patient Care Team: Patient, No Pcp Per as PCP - General (General Practice)  Hematological/Oncological History #Thrombocytopenia in Pregnancy 1) 07/01/2019: WBC 4.9, Hgb 9.8, MCV 88, Plt 99 2) 07/29/2019: WBC 7.5, Hgb 10.1, MCV 88, Plt 98 3) 10/16/2019: WBC 6.7, Hgb 9.8, MCV 85.3, Plt 81. Delivery date moved to 10/20/2019.  4) 10/20/2019: delivery of healthy baby boy 5) 11/28/2019: WBC 7.0, Hgb 12.3, MCV 87, Plt 153   Interval History:  Heidi [name] 19 y.o. female with medical history significant for thrombocytopenia in pregnancy who presents for a follow up visit. The patient's last visit was on 10/16/2019 at which time she established care. In the interim since the last visit the patient delivered healthy baby boy on 10/20/2019.   On exam today Heidi [name] notes that she feels well, but does have fatigue for looking after her 76-monthold baby.  She notes that she is otherwise been quite good.  She reports that she does still pass some occasional dark black clots into a pad, but notes that she is not saturating the pads or having other overt sources of bleeding.  She is continuing to take her iron pills and not having any issues with it at this time.  She denies having any issues with constipation, abdominal pain, or other issues with the medication.  She currently denies any fevers, chills, sweats, nausea, vomiting or diarrhea.  A full 10 point ROS is listed below.  MEDICAL HISTORY:  Past Medical History:  Diagnosis Date  . Medical history non-contributory     SURGICAL HISTORY: Past Surgical History:  Procedure Laterality Date  . CESAREAN SECTION N/A 10/21/2019   Procedure: CESAREAN SECTION;  Surgeon: STruett Mainland DO;  Location: [facility];  Service: Obstetrics;  Laterality: N/A;  . NO PAST SURGERIES     ALLERGIES:  is allergic to strawberry extract.  MEDICATIONS:  Current Outpatient  Medications  Medication Sig Dispense Refill  . Blood Pressure Monitoring (BLOOD PRESSURE KIT) DEVI 1 Device by Does not apply route as needed. (Patient not taking: Reported on 11/28/2019) 1 each 0  . ferrous gluconate (FERGON) 324 MG tablet Take 1 tablet (324 mg total) by mouth daily with breakfast. 30 tablet 3  . ibuprofen (ADVIL) 800 MG tablet Take 1 tablet (800 mg total) by mouth every 8 (eight) hours. (Patient not taking: Reported on 11/04/2019) 30 tablet 0  . Prenatal Vit-Fe Fumarate-FA (MULTIVITAMIN-PRENATAL) 27-0.8 MG TABS tablet Take 1 tablet by mouth daily at 12 noon. 30 tablet 6  . sertraline (ZOLOFT) 25 MG tablet Take 1 tablet (25 mg total) by mouth daily. 60 tablet 6   No current facility-administered medications for this visit.    REVIEW OF SYSTEMS:   Constitutional: ( - ) fevers, ( - )  chills , ( - ) night sweats Eyes: ( - ) blurriness of vision, ( - ) double vision, ( - ) watery eyes Ears, nose, mouth, throat, and face: ( - ) mucositis, ( - ) sore throat Respiratory: ( - ) cough, ( - ) dyspnea, ( - ) wheezes Cardiovascular: ( - ) palpitation, ( - ) chest discomfort, ( - ) lower extremity swelling Gastrointestinal:  ( - ) nausea, ( - ) heartburn, ( - ) change in bowel habits Skin: ( - ) abnormal skin rashes Lymphatics: ( - ) new lymphadenopathy, ( - ) easy bruising Neurological: ( - ) numbness, ( - ) tingling, ( - )  new weaknesses Behavioral/Psych: ( - ) mood change, ( - ) new changes  All other systems were reviewed with the patient and are negative.  PHYSICAL EXAMINATION: ECOG PERFORMANCE STATUS: 1 - Symptomatic but completely ambulatory  Vitals:   01/15/20 1056  BP: (!) 130/73  Pulse: 79  Resp: 18  Temp: 97.7 F (36.5 C)  SpO2: 98%   Filed Weights   01/15/20 1056  Weight: 155 lb 11.2 oz (70.6 kg)    GENERAL: well appearing young African American female in NAD  SKIN: skin color, texture, turgor are normal, no rashes or significant lesions EYES: conjunctiva are  pink and non-injected, sclera clear LUNGS: clear to auscultation and percussion with normal breathing effort HEART: regular rate & rhythm and no murmurs and no lower extremity edema ABDOMEN: well healing C section incision site. No erythema, tenderness, or discharge.  Musculoskeletal: no cyanosis of digits and no clubbing  PSYCH: alert & oriented x 3, fluent speech NEURO: no focal motor/sensory deficits   LABORATORY DATA:  I have reviewed the data as listed CBC Latest Ref Rng & Units 01/15/2020 11/28/2019 10/23/2019  WBC 4.0 - 10.5 K/uL 5.8 7.0 10.6(H)  Hemoglobin 12.0 - 15.0 g/dL 13.4 12.3 7.4(L)  Hematocrit 36 - 46 % 40.1 37.9 22.2(L)  Platelets 150 - 400 K/uL 220 153 90(L)    CMP Latest Ref Rng & Units 01/15/2020 10/16/2019 08/22/2019  Glucose 70 - 99 mg/dL 87 120(H) 103(H)  BUN 6 - 20 mg/dL 7 9 8   Creatinine 0.44 - 1.00 mg/dL 0.72 0.67 0.55  Sodium 135 - 145 mmol/L 141 136 137  Potassium 3.5 - 5.1 mmol/L 3.9 3.6 3.7  Chloride 98 - 111 mmol/L 109 109 107  CO2 22 - 32 mmol/L 22 21(L) 22  Calcium 8.9 - 10.3 mg/dL 10.1 8.6(L) 8.6(L)  Total Protein 6.5 - 8.1 g/dL 8.0 6.1(L) 6.7  Total Bilirubin 0.3 - 1.2 mg/dL 0.5 <0.2(L) <0.2(L)  Alkaline Phos 38 - 126 U/L 66 136(H) 75  AST 15 - 41 U/L 16 20 12(L)  ALT 0 - 44 U/L 12 14 7     RADIOGRAPHIC STUDIES: None relevant to review.  No results found.  ASSESSMENT & PLAN Heidi [name] 19 y.o. female with medical history significant for thrombocytopenia in pregnancy who presents for a follow up visit.  On review today it appears that the patient's labs have completely normalized.  This is likely due to the fact that thrombocytopenia was secondary to benign gestational thrombocytopenia and the anemia was dilutional as well as affected by the patient's iron deficiency.  I would recommend at this time that the patient continue her p.o. iron supplementation for a full 3 months after the hemoglobin normalized.  This is to assure that her iron  stores are fully repleted and we will check iron labs today to ensure that this is increasing.  Our goal would be to a ferritin of at least 100.  Given the patient's counts have corrected there is no need for routine follow-up in our clinic, though we are happy to see her again in the event that she were to become pregnant again or have new hematological issues in the future.  #Thrombocytopenia in Pregnancy, resolved.  --most likely etiology for this patient's thrombocytopenia is benign gestational thrombocytopenia ( J Prenat Med. 2011;5(4):90-92). --labs on last check (11/28/2019) showed a rebound to normal levels --f/u in clinic as needed.   #Iron Deficiency Anemia in Pregnancy -- improved after delivery, possible anemia of pregnancy with dilutional  effect  --will order repeat iron studies and CBC today.  --continue PO ferrous sulfate 338m daily for at least 3 months from normalization of Hgb (until Sept 2021). This is to assure her iron stores remain replete --f/u in clinic as needed.   No orders of the defined types were placed in this encounter.   All questions were answered. The patient knows to call the clinic with any problems, questions or concerns.  A total of more than 20 minutes were spent on this encounter and over half of that time was spent on counseling and coordination of care as outlined above.   JLedell Peoples MD [facility]ology[facility]On[facility]Phone: [phone]: [phone]: [email or web address]  01/15/2020 11:27 AM

## 2020-01-15 ENCOUNTER — Encounter: Payer: Self-pay | Admitting: Hematology and Oncology

## 2020-01-15 ENCOUNTER — Other Ambulatory Visit: Payer: Self-pay

## 2020-01-15 ENCOUNTER — Inpatient Hospital Stay (HOSPITAL_BASED_OUTPATIENT_CLINIC_OR_DEPARTMENT_OTHER): Payer: Medicaid Other | Admitting: Hematology and Oncology

## 2020-01-15 ENCOUNTER — Inpatient Hospital Stay: Payer: Medicaid Other | Attending: Hematology and Oncology

## 2020-01-15 VITALS — BP 130/73 | HR 79 | Temp 97.7°F | Resp 18 | Ht 61.0 in | Wt 155.7 lb

## 2020-01-15 DIAGNOSIS — O99113 Other diseases of the blood and blood-forming organs and certain disorders involving the immune mechanism complicating pregnancy, third trimester: Secondary | ICD-10-CM

## 2020-01-15 DIAGNOSIS — D500 Iron deficiency anemia secondary to blood loss (chronic): Secondary | ICD-10-CM

## 2020-01-15 DIAGNOSIS — Z3490 Encounter for supervision of normal pregnancy, unspecified, unspecified trimester: Secondary | ICD-10-CM | POA: Diagnosis not present

## 2020-01-15 DIAGNOSIS — D696 Thrombocytopenia, unspecified: Secondary | ICD-10-CM

## 2020-01-15 DIAGNOSIS — D509 Iron deficiency anemia, unspecified: Secondary | ICD-10-CM | POA: Diagnosis present

## 2020-01-15 LAB — CBC WITH DIFFERENTIAL (CANCER CENTER ONLY)
Abs Immature Granulocytes: 0.02 10*3/uL (ref 0.00–0.07)
Basophils Absolute: 0 10*3/uL (ref 0.0–0.1)
Basophils Relative: 0 %
Eosinophils Absolute: 0.1 10*3/uL (ref 0.0–0.5)
Eosinophils Relative: 1 %
HCT: 40.1 % (ref 36.0–46.0)
Hemoglobin: 13.4 g/dL (ref 12.0–15.0)
Immature Granulocytes: 0 %
Lymphocytes Relative: 36 %
Lymphs Abs: 2.1 10*3/uL (ref 0.7–4.0)
MCH: 28.6 pg (ref 26.0–34.0)
MCHC: 33.4 g/dL (ref 30.0–36.0)
MCV: 85.5 fL (ref 80.0–100.0)
Monocytes Absolute: 0.5 10*3/uL (ref 0.1–1.0)
Monocytes Relative: 8 %
Neutro Abs: 3.2 10*3/uL (ref 1.7–7.7)
Neutrophils Relative %: 55 %
Platelet Count: 220 10*3/uL (ref 150–400)
RBC: 4.69 MIL/uL (ref 3.87–5.11)
RDW: 13.2 % (ref 11.5–15.5)
WBC Count: 5.8 10*3/uL (ref 4.0–10.5)
nRBC: 0 % (ref 0.0–0.2)

## 2020-01-15 LAB — RETIC PANEL
Immature Retic Fract: 6 % (ref 2.3–15.9)
RBC.: 4.77 MIL/uL (ref 3.87–5.11)
Retic Count, Absolute: 39.1 10*3/uL (ref 19.0–186.0)
Retic Ct Pct: 0.8 % (ref 0.4–3.1)
Reticulocyte Hemoglobin: 33.8 pg (ref >27.9)

## 2020-01-15 LAB — CMP (CANCER CENTER ONLY)
ALT: 12 U/L (ref 0–44)
AST: 16 U/L (ref 15–41)
Albumin: 4.1 g/dL (ref 3.5–5.0)
Alkaline Phosphatase: 66 U/L (ref 38–126)
Anion gap: 10 (ref 5–15)
BUN: 7 mg/dL (ref 6–20)
CO2: 22 mmol/L (ref 22–32)
Calcium: 10.1 mg/dL (ref 8.9–10.3)
Chloride: 109 mmol/L (ref 98–111)
Creatinine: 0.72 mg/dL (ref 0.44–1.00)
GFR, Est AFR Am: >60 mL/min (ref >60)
GFR, Estimated: >60 mL/min (ref >60)
Glucose, Bld: 87 mg/dL (ref 70–99)
Potassium: 3.9 mmol/L (ref 3.5–5.1)
Sodium: 141 mmol/L (ref 135–145)
Total Bilirubin: 0.5 mg/dL (ref 0.3–1.2)
Total Protein: 8 g/dL (ref 6.5–8.1)

## 2020-01-15 LAB — IRON AND TIBC
Iron: 84 ug/dL (ref 41–142)
Saturation Ratios: 29 % (ref 21–57)
TIBC: 288 ug/dL (ref 236–444)
UIBC: 204 ug/dL (ref 120–384)

## 2020-01-15 LAB — FERRITIN: Ferritin: 103 ng/mL (ref 11–307)

## 2020-01-15 MED ORDER — FERROUS GLUCONATE 324 (38 FE) MG PO TABS: 324.0000 mg | ORAL_TABLET | Freq: Every day | ORAL | 0 refills | Status: DC

## 2020-01-16 ENCOUNTER — Telehealth: Payer: Self-pay | Admitting: *Deleted

## 2020-01-16 NOTE — Telephone Encounter (Signed)
TCT patient regarding lab results.  Spoke with patient and advised that all her lab results are normal after her delivery. Dr. Leonides Schanz recommends she continue her oral iron tablets for 2 more months and then she can stop. Advised that she does not need to be seen in our clinic again unless there is a change in future lab results.  She voiced understanding. No further questions or concerns

## 2020-01-16 NOTE — Telephone Encounter (Signed)
-----   Message from Jaci Standard, MD sent at 01/16/2020  9:11 AM EDT ----- Please let Heidi [name] know that her labs look great. Iron levels are up to normal levels. I would recommend she continue to PO iron pills for another 2 months and then she can d/c. We do not require routine f/u in our clinic unless her numbers were to drop again. ----- Message ----- From: Interface, Lab In [place] Sent: 01/15/2020  10:55 AM EDT To: Jaci Standard, MD

## 2021-01-20 ENCOUNTER — Emergency Department (HOSPITAL_COMMUNITY)
Admission: EM | Admit: 2021-01-20 | Discharge: 2021-01-24 | Disposition: A | Discharge status: Home / Self Care | Payer: Medicaid Other | Attending: Emergency Medicine | Admitting: Emergency Medicine

## 2021-01-20 ENCOUNTER — Other Ambulatory Visit: Payer: Self-pay

## 2021-01-20 ENCOUNTER — Ambulatory Visit (HOSPITAL_COMMUNITY)
Admission: EM | Admit: 2021-01-20 | Discharge: 2021-01-20 | Disposition: A | Discharge status: Other Acute Inpatient Hospital | Payer: Medicaid Other | Attending: Nurse Practitioner | Admitting: Nurse Practitioner

## 2021-01-20 DIAGNOSIS — U071 COVID-19: Secondary | ICD-10-CM | POA: Insufficient documentation

## 2021-01-20 DIAGNOSIS — R45851 Suicidal ideations: Secondary | ICD-10-CM | POA: Insufficient documentation

## 2021-01-20 DIAGNOSIS — F29 Unspecified psychosis not due to a substance or known physiological condition: Secondary | ICD-10-CM | POA: Insufficient documentation

## 2021-01-20 DIAGNOSIS — F332 Major depressive disorder, recurrent severe without psychotic features: Secondary | ICD-10-CM | POA: Insufficient documentation

## 2021-01-20 DIAGNOSIS — R45850 Homicidal ideations: Secondary | ICD-10-CM | POA: Insufficient documentation

## 2021-01-20 DIAGNOSIS — Z638 Other specified problems related to primary support group: Secondary | ICD-10-CM

## 2021-01-20 DIAGNOSIS — Z79899 Other long term (current) drug therapy: Secondary | ICD-10-CM | POA: Diagnosis not present

## 2021-01-20 DIAGNOSIS — Z9151 Personal history of suicidal behavior: Secondary | ICD-10-CM | POA: Insufficient documentation

## 2021-01-20 DIAGNOSIS — F4325 Adjustment disorder with mixed disturbance of emotions and conduct: Secondary | ICD-10-CM | POA: Diagnosis present

## 2021-01-20 LAB — CBC WITH DIFFERENTIAL/PLATELET
Abs Immature Granulocytes: 0.02 10*3/uL (ref 0.00–0.07)
Basophils Absolute: 0 10*3/uL (ref 0.0–0.1)
Basophils Relative: 0 %
Eosinophils Absolute: 0 10*3/uL (ref 0.0–0.5)
Eosinophils Relative: 1 %
HCT: 39.6 % (ref 36.0–46.0)
Hemoglobin: 13.3 g/dL (ref 12.0–15.0)
Immature Granulocytes: 0 %
Lymphocytes Relative: 27 %
Lymphs Abs: 1.3 10*3/uL (ref 0.7–4.0)
MCH: 28.7 pg (ref 26.0–34.0)
MCHC: 33.6 g/dL (ref 30.0–36.0)
MCV: 85.5 fL (ref 80.0–100.0)
Monocytes Absolute: 0.9 10*3/uL (ref 0.1–1.0)
Monocytes Relative: 19 %
Neutro Abs: 2.6 10*3/uL (ref 1.7–7.7)
Neutrophils Relative %: 53 %
Platelets: 153 10*3/uL (ref 150–400)
RBC: 4.63 MIL/uL (ref 3.87–5.11)
RDW: 13.1 % (ref 11.5–15.5)
WBC: 5 10*3/uL (ref 4.0–10.5)
nRBC: 0 % (ref 0.0–0.2)

## 2021-01-20 LAB — LIPID PANEL
Cholesterol: 231 mg/dL — ABNORMAL HIGH (ref 0–200)
HDL: 38 mg/dL — ABNORMAL LOW (ref >40)
LDL Cholesterol: 185 mg/dL — ABNORMAL HIGH (ref 0–99)
Total CHOL/HDL Ratio: 6.1 RATIO
Triglycerides: 41 mg/dL (ref <150)
VLDL: 8 mg/dL (ref 0–40)

## 2021-01-20 LAB — POCT PREGNANCY, URINE: Preg Test, Ur: NEGATIVE

## 2021-01-20 LAB — POCT URINE DRUG SCREEN - MANUAL ENTRY (I-SCREEN)
POC Amphetamine UR: NOT DETECTED
POC Buprenorphine (BUP): NOT DETECTED
POC Cocaine UR: NOT DETECTED
POC Marijuana UR: NOT DETECTED
POC Methadone UR: NOT DETECTED
POC Methamphetamine UR: NOT DETECTED
POC Morphine: NOT DETECTED
POC Oxazepam (BZO): NOT DETECTED
POC Oxycodone UR: NOT DETECTED
POC Secobarbital (BAR): NOT DETECTED

## 2021-01-20 LAB — COMPREHENSIVE METABOLIC PANEL
ALT: 12 U/L (ref 0–44)
AST: 19 U/L (ref 15–41)
Albumin: 3.9 g/dL (ref 3.5–5.0)
Alkaline Phosphatase: 60 U/L (ref 38–126)
Anion gap: 7 (ref 5–15)
BUN: 13 mg/dL (ref 6–20)
CO2: 22 mmol/L (ref 22–32)
Calcium: 9.2 mg/dL (ref 8.9–10.3)
Chloride: 108 mmol/L (ref 98–111)
Creatinine, Ser: 0.62 mg/dL (ref 0.44–1.00)
GFR, Estimated: >60 mL/min (ref >60)
Glucose, Bld: 97 mg/dL (ref 70–99)
Potassium: 3.9 mmol/L (ref 3.5–5.1)
Sodium: 137 mmol/L (ref 135–145)
Total Bilirubin: 0.6 mg/dL (ref 0.3–1.2)
Total Protein: 7.4 g/dL (ref 6.5–8.1)

## 2021-01-20 LAB — HEMOGLOBIN A1C
Hgb A1c MFr Bld: 5.5 % (ref 4.8–5.6)
Mean Plasma Glucose: 111.15 mg/dL

## 2021-01-20 LAB — TSH: TSH: 1.738 u[IU]/mL (ref 0.350–4.500)

## 2021-01-20 LAB — POC SARS CORONAVIRUS 2 AG -  ED: SARS Coronavirus 2 Ag: POSITIVE — AB

## 2021-01-20 LAB — ETHANOL: Alcohol, Ethyl (B): <10 mg/dL (ref <10)

## 2021-01-20 MED ORDER — TRAZODONE HCL 50 MG PO TABS: 50.0000 mg | ORAL_TABLET | Freq: Every evening | ORAL | Status: DC | PRN

## 2021-01-20 MED ORDER — ALUM & MAG HYDROXIDE-SIMETH 200-200-20 MG/5ML PO SUSP: 30.0000 mL | ORAL | Status: DC | PRN

## 2021-01-20 MED ORDER — MAGNESIUM HYDROXIDE 400 MG/5ML PO SUSP: 30.0000 mL | Freq: Every day | ORAL | Status: DC | PRN

## 2021-01-20 MED ORDER — HYDROXYZINE HCL 25 MG PO TABS
25.0000 mg | ORAL_TABLET | Freq: Three times a day (TID) | ORAL | Status: DC | PRN
  Administered 2021-01-20: 25 mg via ORAL
  Filled 2021-01-20: qty 1

## 2021-01-20 MED ORDER — SERTRALINE HCL 25 MG PO TABS
25.0000 mg | ORAL_TABLET | Freq: Every day | ORAL | Status: DC
  Administered 2021-01-20: 25 mg via ORAL
  Filled 2021-01-20: qty 1

## 2021-01-20 MED ORDER — ACETAMINOPHEN 325 MG PO TABS: 650.0000 mg | ORAL_TABLET | Freq: Four times a day (QID) | ORAL | Status: DC | PRN

## 2021-01-20 MED ORDER — SERTRALINE HCL 25 MG PO TABS
25.0000 mg | ORAL_TABLET | Freq: Every day | ORAL | Status: DC
  Administered 2021-01-21 – 2021-01-24 (×4): 25 mg via ORAL
  Filled 2021-01-20 (×4): qty 1

## 2021-01-20 NOTE — ED Notes (Signed)
Blue locker - 1 cell phone
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
## 2021-01-20 NOTE — ED Notes (Signed)
Locker #[id number]  
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
## 2021-01-20 NOTE — ED Notes (Signed)
Report given to Lazarus Salines nurse
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
## 2021-01-21 LAB — RESP PANEL BY RT-PCR (FLU A&B, COVID) ARPGX2
Influenza A by PCR: NEGATIVE
Influenza B by PCR: NEGATIVE
SARS Coronavirus 2 by RT PCR: POSITIVE — AB

## 2021-01-21 NOTE — ED Notes (Signed)
Patient belongings inventoried and placed in visitor locker #4 in purple zone
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
## 2021-01-22 NOTE — ED Notes (Signed)
Pt resting. Even Resp. No distress at this time. 

## 2021-01-22 NOTE — ED Notes (Signed)
Patient states she is not feeling suicidal at this time. States she thinks she had COVID before coming to [facility] yesterday. Will ask ED provider if they want to retest to expedite placement efforts.

## 2021-01-22 NOTE — ED Provider Notes (Signed)
Emergency Medicine Observation Re-evaluation Note  Heidi [name] is a 20 y.o. female, seen on rounds today.  Pt initially presented to the ED for complaints of anxiety and depression. No new symptoms overnight or this AM. Awaiting BH placement.  Physical Exam  BP 108/73 (BP Location: Left Arm)   Pulse 77   Temp 98.2 F (36.8 C)   Resp 14   Ht 1.549 m (5\' 1" )   Wt 85.3 kg   SpO2 99%   BMI 35.52 kg/m  Physical Exam General: calm, resting Cardiac: regular rate Lungs: breathing comfortably Psych: resting. Pt has not been responding to internal stimuli. Does have depressed mood. Denies SI.   ED Course / MDM  EKG:   I have reviewed the labs performed to date as well as medications administered while in observation.  Recent changes in the last 24 hours include stabilization in ED, restarting meds, BH reassessment.   Plan  Current plan is that pt is recommended for inpatient psych care. ?whether patient would be appropriate for transitioning to intensive outpatient program.  BH reassessment, and BH med management is ongoing.   Dispo per [facility] team.      [facility], MD 01/22/21 [phone]

## 2021-01-22 NOTE — ED Notes (Signed)
Pt received lunch tray 

## 2021-01-22 NOTE — BH Assessment (Addendum)
Disposition:   Patient assessed at [facility]- Nira Conn, NP recommended inpatient psychiatric treatment. Pt tested positive for COVID and was transferred to the ED for treatment/monitoring until IP psych is available.  Pt is COVID+ and may be difficult to place due to COVID status. However, was faxed out to facilities for consideration of inpatient treatment. Patient to re-assessed daily while he remains in the ED.    [facility]  Pending - Request Sent N/A [address], [place] [address] [phone] --  [facility]  Pending - Request Sent N/A [address], [place] [address] [phone] --  [facility]  Pending - Request Sent N/A [address] [place] [address] [phone] --  [facility]  Pending - Request Sent N/A [address] [place], [place] [address] [phone] --  [facility]  Pending - Request Sent N/A [address] [place], [place] [address] [phone] --  [facility]  Pending - Request Sent N/A [address], [place] [address] [phone] --  [facility]  Pending - Request Sent N/A [facility] [address] [place] [address] [phone] --  [facility]  Pending - Request Sent N/A [phone] [address] [place] [address] [phone] --  [facility]  Pending - Request Sent N/A [address] [place], [place] [address] [phone] --  [facility]  Pending - Request Sent N/A [address] [place] [address] [phone] --  [facility]  Pending - Request Sent N/A [address] [place] [address] [phone] --  [facility]  Pending - Request Sent N/A [address] [place] [address]  [phone] --  [facility]  Pending - Request Sent N/A [address] [place] [address] [phone] --  [facility]  Pending - Request Sent N/A [address], [place] [address] [phone] --  [facility]  Pending - Request Sent N/A [address] [place] [address] [phone] --  [facility]  Pending - Request Sent N/A [name]., [place] [address] [phone] --  [facility]  Pending - Request Sent N/A [address] [place] [address] [phone] --  [facility]  Pending - Request Sent N/A [address] [place] [address] [phone] --  [facility]  Pending - Request Sent N/A [address], [place] [address] [phone] --  [facility]  Pending - Request Sent N/A [address] [place] [address] [phone] --  [facility]  Pending - Request Sent N/A [address] [place], [place] [address] [phone] --  [facility]  Pending - Request Sent N/A [address], [place] [address] [phone] --

## 2021-01-22 NOTE — ED Notes (Signed)
Patient belongings are in locker #4, Purple [place], [facility] Emergency [facility]. Contents are clothes, white crocs, and cell phone is in a plastic bag.

## 2021-01-23 NOTE — Progress Notes (Signed)
CSW followed up with [facility] in regards to this patient being under review at [facility]. It was reported that the patient has been decline due to no appropriate beds available at this time.  Crissie Reese, MSW, LCSW-A, LCAS-A Phone: [phone] Disposition/TOC

## 2021-01-23 NOTE — Progress Notes (Signed)
Per Celesta Gentile, patient meets criteria for inpatient treatment. There are no available or appropriate beds at [facility] today. CSW faxed referrals to the following facilities for review:   [facility]  Pending - Request Sent N/A [address], [place] [address] [phone] --  [facility]  Pending - Request Sent N/A [address], [place] [address] [phone] --  [facility]  Pending - Request Sent N/A [address] [place] [address] [phone] --  [facility]  Pending - Request Sent N/A [address] [place], [place] [address] [phone] --  [facility]  Pending - Request Sent N/A [address] [place], [place] [address] [phone] --  [facility]  Pending - Request Sent N/A [address] [place] [address] [phone] --  [facility]  Pending - Request Sent N/A [facility] [address] [facility] [address] [place] [address] [phone] --  [facility]  Pending - Request Sent N/A [phone] [address] [place] [place] [address] [phone] --  [facility]  Pending - Request Sent N/A [address] [place], [place] [address] [phone] --  [facility]  Pending - Request Sent N/A [address] [place] [address] [phone] --  [facility]  Pending - Request Sent N/A [address] [place] [address] [phone] --  [facility]  Pending - Request Sent N/A [address] [place] [address] [phone] --  [facility]  Pending - Request Sent N/A [address] [place] [address] [phone] --  [facility]  Pending - Request Sent N/A [address] [place]  [address] [phone] --  [facility]  Pending - Request Sent N/A [address], [place] [address] [phone] --  [facility]  Pending - Request Sent N/A [name] [place] [address] [phone] --  [facility]  Pending - Request Sent N/A [address] [place] [address] [phone] --  [facility]  Pending - Request Sent N/A [address] [place] [address] [phone] --  [facility]  Pending - Request Sent N/A [address], [place] [address] [phone] --  [facility]  Pending - Request Sent N/A [address] [place] [address] [phone] --  [facility]  Pending - Request Sent N/A [address] [place], [place] [address] [phone] --  [facility]  Pending - Request Sent N/A [address] [place] [address] [phone] --   TTS will continue to seek bed placement.  Crissie Reese, MSW, LCSW-A, LCAS-A Phone: [phone] Disposition/TOC
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
## 2021-01-23 NOTE — ED Notes (Signed)
Pt given sandwich bag and pillows

## 2021-01-23 NOTE — Progress Notes (Signed)
Heidi [name] with [facility] admission contacted CSW in reference to this patient for possible placement. CSW provided the nurses number for [facility] to contact for further review.   Crissie Reese, MSW, LCSW-A, LCAS-A Phone: [phone] Disposition/TOC

## 2021-01-23 NOTE — ED Notes (Signed)
Pt ate breakfast. Pt is currently calm and cooperative resting in the bed, with sitter outside of pt's room d/t COVID + status. Will continue to monitor pt.

## 2021-01-23 NOTE — ED Notes (Signed)
Pt tearful in room, asking why she is here and when she can go home. This RN explained plan of care to pt and provided pt with warm blankets and bedside commode in room r/t covid positive status.

## 2021-01-24 ENCOUNTER — Encounter (HOSPITAL_COMMUNITY): Payer: Self-pay | Admitting: Registered Nurse

## 2021-01-24 DIAGNOSIS — R45851 Suicidal ideations: Secondary | ICD-10-CM

## 2021-01-24 DIAGNOSIS — F4325 Adjustment disorder with mixed disturbance of emotions and conduct: Secondary | ICD-10-CM

## 2021-01-24 DIAGNOSIS — Z638 Other specified problems related to primary support group: Secondary | ICD-10-CM
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
## 2021-01-24 NOTE — ED Notes (Signed)
Placed Breakfast Order 

## 2021-01-24 NOTE — ED Notes (Signed)
1:1 safety sitter now with pt 

## 2021-01-24 NOTE — Discharge Instructions (Addendum)
For your behavioral health needs you are advised to follow up with [facility].  PLEASE WAIT UNTIL YOU ARE CLEARED FROM COVID-19 BEFORE SEEKING ANY IN-PERSON SERVICES:      [facility]      [address]      [place], [place] [address]      [phone]      They offer psychiatry/medication management and therapy.  New patients are seen in their walk-in clinic.  Walk-in hours are Monday - Thursday from 8:00 am - 11:00 am for psychiatry, and Friday from 1:00 pm - 4:00 pm for therapy.  Walk-in patients are seen on a first come, first served basis, so try to arrive as early as possible for the best chance of being seen the same day.  For your shelter needs, contact the following service providers:       [facility] (operated by [facility])      [address] [place], [place] [address]      [phone]       [facility]      [address]      [place], [place] [address]      [phone]       [facility]      [phone]      [place], [place] [address]      For access contact [facility] at [phone]  For day shelter and other supportive services for the homeless, contact the [facility]):       [facility]      [address]      [place], [place] [address]      [phone]  For transitional housing, Pension scheme manager.  They provide longer term housing than a shelter, but there is an application process:       [facility]      [address], [place] [address]      [phone]

## 2021-01-24 NOTE — BH Assessment (Signed)
[facility] Assessment Progress Note   Per Shuvon Rankin, NP, this pt does not require psychiatric hospitalization at this time.  Pt is psychiatrically cleared.  Discharge instructions include referral information for [facility], as well as information about area supportive services for the homeless.  EDP Ernie Avena, MD and pt's nurse, Byrd Hesselbach, have been notified.  Doylene Canning, MA Triage Specialist [phone]

## 2021-01-24 NOTE — ED Provider Notes (Signed)
Emergency Medicine Observation Re-evaluation Note  MAKENNAH OMURA is a 20 y.o. female, seen on rounds today.  Pt initially presented to the ED for complaints of Psychiatric Evaluation Currently, the patient is stable.  Physical Exam  BP (!) 102/59 (BP Location: Right Arm)   Pulse 65   Temp 98.7 F (37.1 C) (Oral)   Resp 15   Ht 5\' 1"  (1.549 m)   Wt 85.3 kg   SpO2 100%   BMI 35.52 kg/m  Physical Exam General: resting Cardiac: regular rhythm Lungs: respirations even and unlabored Psych: stable, resting  ED Course / MDM  EKG:   I have reviewed the labs performed to date as well as medications administered while in observation.  Recent changes in the last 24 hours include psychiatry cleared for continued outpatient management. TOC consulted for dispo plan given patient's living situation and COVID + diagnosis.  Plan  Current plan is for Discharge.  Aren C Kelsay is not under involuntary commitment.     Joanne Gavel, MD 01/24/21 [phone]

## 2021-01-24 NOTE — Social Work (Signed)
CSW called into room to speak with Pt. regarding consult for homelessness.  Per Pt she is currently living in motel. Pt declined homelessness resources. CSW and Pt discussed access to food stamps, Pt states she will reapply soon, has been busy with childcare and work.  No further social work needs identified.

## 2021-01-24 NOTE — Progress Notes (Signed)
Patient information has been sent to [facility] via secure chat to review for potential admission. Patient meets inpatient criteria per Nira Conn, NP.   Situation ongoing, CSW will continue to monitor progress.    Signed:  Damita Dunnings, MSW, LCSW-A  01/24/2021 8:18 AM

## 2021-08-09 ENCOUNTER — Ambulatory Visit: Payer: Self-pay

## 2021-08-09 ENCOUNTER — Other Ambulatory Visit: Payer: Self-pay

## 2021-08-09 ENCOUNTER — Other Ambulatory Visit: Payer: Self-pay | Admitting: Family Medicine

## 2021-08-09 DIAGNOSIS — M25561 Pain in right knee: Secondary | ICD-10-CM

## 2021-12-03 ENCOUNTER — Emergency Department (HOSPITAL_COMMUNITY)
Admission: EM | Admit: 2021-12-03 | Discharge: 2021-12-03 | Disposition: A | Discharge status: Home / Self Care | Payer: Medicaid Other | Attending: Emergency Medicine | Admitting: Emergency Medicine

## 2021-12-03 ENCOUNTER — Encounter (HOSPITAL_COMMUNITY): Payer: Self-pay

## 2021-12-03 ENCOUNTER — Other Ambulatory Visit: Payer: Self-pay

## 2021-12-03 DIAGNOSIS — J029 Acute pharyngitis, unspecified: Secondary | ICD-10-CM | POA: Diagnosis not present

## 2021-12-03 LAB — GROUP A STREP BY PCR: Group A Strep by PCR: NOT DETECTED

## 2021-12-03 NOTE — ED Provider Triage Note (Signed)
Emergency Medicine Provider Triage Evaluation Note  Heidi [name] , a 21 y.o. female  was evaluated in triage.  Pt complains of sore throat onset 2 to 3 days.  Has tried over-the-counter cold and flu medications.  Denies sick contacts.  Has resolved nausea.  Has associated painful swallowing.  Denies trouble swallowing or trouble breathing.  Review of Systems  Positive: As per HPI above Negative:   Physical Exam  BP (!) 155/108 (BP Location: Left Arm)   Pulse 88   Temp 99.1 F (37.3 C) (Oral)   Resp 16   Ht 5\' 1"  (1.549 m)   Wt 87.1 kg   SpO2 93%   BMI 36.28 kg/m  Gen:   Awake, no distress   Resp:  Normal effort  MSK:   Moves extremities without difficulty  Other:  Right tonsillar enlargement with exudate noted. Uvula midline without swelling. Patent airway.  Medical Decision Making  Medically screening exam initiated at 3:39 PM.  Appropriate orders placed.  Shaquira C Thier was informed that the remainder of the evaluation will be completed by another provider, this initial triage assessment does not replace that evaluation, and the importance of remaining in the ED until their evaluation is complete.  Work-up initiated.    Rollins Wrightson A, PA-C 12/03/21 1544

## 2021-12-03 NOTE — Discharge Instructions (Signed)
It was a pleasure taking care of you today!  Ensure to maintain fluid intake with tea, soup, broth, water, Pedialyte, Gatorade.  You may take 600 mg ibuprofen every 6 hours or 500 mg every 6 hours as needed for your symptoms.  Return to the emergency department you are experiencing increasing/worsening trouble breathing, trouble swallowing, drooling, fever, worsening symptoms.

## 2021-12-03 NOTE — ED Triage Notes (Signed)
Pt states that for two days she has been feeling generally unwell. Pt states that she noticed today that her tonsil on the R side is enlarged. Pt denies SOB, difficulty swallowing.

## 2021-12-03 NOTE — ED Provider Notes (Signed)
[facility] DEPT Provider Note   CSN: [id number] Arrival date & time: 12/03/21  1510     History  Chief Complaint  Patient presents with   Sore Throat    Heidi [name] is a 21 y.o. female who presents to the emergency department complaining of sore throat onset 2 to 3 days.  Has tried over-the-counter cold and flu medications.  Denies sick contacts.  Has resolved nausea.  Has associated painful swallowing.  Denies trouble swallowing or trouble breathing.  The history is provided by the patient. No language interpreter was used.       Home Medications Prior to Admission medications   Medication Sig Start Date End Date Taking? Authorizing Provider  Blood Pressure Monitoring (BLOOD PRESSURE KIT) DEVI 1 Device by Does not apply route as needed. 06/30/19   Tresea Mall, CNM  ferrous gluconate (FERGON) 324 MG tablet Take 1 tablet (324 mg total) by mouth daily with breakfast. Patient not taking: No sig reported 01/15/20   Orson Slick, MD  sertraline (ZOLOFT) 25 MG tablet Take 1 tablet (25 mg total) by mouth daily. 12/16/19   Starr Lake, CNM      Allergies    Strawberry extract    Review of Systems   Review of Systems  Constitutional:  Negative for chills and fever.  HENT:  Positive for sore throat. Negative for trouble swallowing.   Gastrointestinal:  Positive for nausea (resolved). Negative for vomiting.  All other systems reviewed and are negative.   Physical Exam Updated Vital Signs BP (!) 155/108 (BP Location: Left Arm)   Pulse 88   Temp 99.1 F (37.3 C) (Oral)   Resp 16   Ht 5' 1"  (1.549 m)   Wt 87.1 kg   SpO2 93%   BMI 36.28 kg/m  Physical Exam Vitals and nursing note reviewed.  Constitutional:      General: She is not in acute distress.    Appearance: She is not diaphoretic.  HENT:     Head: Normocephalic and atraumatic.     Mouth/Throat:     Pharynx: Oropharynx is clear. Uvula midline. No  oropharyngeal exudate, posterior oropharyngeal erythema or uvula swelling.     Tonsils: No tonsillar exudate or tonsillar abscesses.     Comments: Right tonsillar enlargement with exudate noted. Uvula midline without swelling. Patent airway. Eyes:     General: No scleral icterus.    Conjunctiva/sclera: Conjunctivae normal.  Cardiovascular:     Rate and Rhythm: Normal rate and regular rhythm.     Pulses: Normal pulses.     Heart sounds: Normal heart sounds.  Pulmonary:     Effort: Pulmonary effort is normal. No respiratory distress.     Breath sounds: Normal breath sounds. No wheezing.  Abdominal:     General: Bowel sounds are normal.     Palpations: Abdomen is soft. There is no mass.     Tenderness: There is no abdominal tenderness. There is no guarding or rebound.  Musculoskeletal:        General: Normal range of motion.     Cervical back: Normal range of motion and neck supple.  Skin:    General: Skin is warm and dry.  Neurological:     Mental Status: She is alert.  Psychiatric:        Behavior: Behavior normal.    ED Results / Procedures / Treatments   Labs (all labs ordered are listed, but only abnormal results are displayed) Labs  Reviewed  GROUP A STREP BY PCR    EKG None  Radiology No results found.  Procedures Procedures    Medications Ordered in ED Medications - No data to display  ED Course/ Medical Decision Making/ A&P                           Medical Decision Making  Pt presents with sore throat onset 2-3 days.  Tried over-the-counter medications.  No sick contacts. Vital signs, stable, patient afebrile. On exam, pt with mild right tonsillar enlargement with exudate noted.  Uvula midline without swelling.  Patent airway. No acute cardiovascular, respiratory exam findings. Differential diagnosis includes strep pharyngitis, viral pharyngitis, PTA, retropharyngeal abscess.   Labs:  I ordered, and personally interpreted labs.  The pertinent results  include:   Strep swab negative   Social Determinants of Health: Pt doesn't have a PCP at this time.   Disposition: Presentation suspicious for viral pharyngitis.  Doubt strep pharyngitis, PTA, retropharyngeal abscess at this time. After consideration of the diagnostic results and the patients response to treatment, I feel that the patient would benefit from Discharge home. Supportive care measures and strict return precautions discussed with patient at bedside. Pt acknowledges and verbalizes understanding. Pt appears safe for discharge. Follow up as indicated in discharge paperwork.    This chart was dictated using voice recognition software, Dragon. Despite the best efforts of this provider to proofread and correct errors, errors may still occur which can change documentation meaning.   Final Clinical Impression(s) / ED Diagnoses Final diagnoses:  Viral pharyngitis    Rx / DC Orders ED Discharge Orders     None         Gracen Ringwald A, PA-C 12/03/21 1932    Valarie Merino, MD 12/04/21 2312

## 2022-06-22 ENCOUNTER — Emergency Department (HOSPITAL_COMMUNITY)
Admission: EM | Admit: 2022-06-22 | Discharge: 2022-06-23 | Disposition: A | Discharge status: Other Acute Inpatient Hospital | Payer: Medicaid Other | Source: Home / Self Care | Attending: Student | Admitting: Student

## 2022-06-22 DIAGNOSIS — X838XXA Intentional self-harm by other specified means, initial encounter: Secondary | ICD-10-CM | POA: Insufficient documentation

## 2022-06-22 DIAGNOSIS — Z1152 Encounter for screening for COVID-19: Secondary | ICD-10-CM | POA: Insufficient documentation

## 2022-06-22 DIAGNOSIS — Z79899 Other long term (current) drug therapy: Secondary | ICD-10-CM | POA: Insufficient documentation

## 2022-06-22 DIAGNOSIS — W260XXA Contact with knife, initial encounter: Secondary | ICD-10-CM | POA: Insufficient documentation

## 2022-06-22 DIAGNOSIS — T1491XA Suicide attempt, initial encounter: Secondary | ICD-10-CM | POA: Insufficient documentation

## 2022-06-22 DIAGNOSIS — F332 Major depressive disorder, recurrent severe without psychotic features: Secondary | ICD-10-CM | POA: Insufficient documentation

## 2022-06-22 DIAGNOSIS — N9489 Other specified conditions associated with female genital organs and menstrual cycle: Secondary | ICD-10-CM | POA: Insufficient documentation

## 2022-06-22 DIAGNOSIS — S61511A Laceration without foreign body of right wrist, initial encounter: Secondary | ICD-10-CM | POA: Insufficient documentation

## 2022-06-22 DIAGNOSIS — Z8616 Personal history of COVID-19: Secondary | ICD-10-CM | POA: Insufficient documentation

## 2022-06-22 DIAGNOSIS — Z23 Encounter for immunization: Secondary | ICD-10-CM | POA: Insufficient documentation

## 2022-06-22 DIAGNOSIS — R45851 Suicidal ideations: Secondary | ICD-10-CM

## 2022-06-22 LAB — RAPID URINE DRUG SCREEN, HOSP PERFORMED
Amphetamines: NOT DETECTED
Barbiturates: NOT DETECTED
Benzodiazepines: NOT DETECTED
Cocaine: NOT DETECTED
Opiates: NOT DETECTED
Tetrahydrocannabinol: NOT DETECTED

## 2022-06-22 LAB — CBG MONITORING, ED: Glucose-Capillary: 126 mg/dL — ABNORMAL HIGH (ref 70–99)
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
## 2022-06-22 NOTE — ED Notes (Addendum)
Pt has 1 belonging's bag containing her clothes, black purse, and apple watch. Bag is labeled and placed in cabinets 19-22. Security wanded pt

## 2022-06-22 NOTE — ED Notes (Signed)
Pt. Belonging locked up in the cabinet including clothes and phone.

## 2022-06-23 ENCOUNTER — Other Ambulatory Visit: Payer: Self-pay

## 2022-06-23 ENCOUNTER — Emergency Department (HOSPITAL_COMMUNITY): Payer: Medicaid Other

## 2022-06-23 ENCOUNTER — Encounter (HOSPITAL_COMMUNITY): Payer: Self-pay | Admitting: Psychiatry

## 2022-06-23 ENCOUNTER — Inpatient Hospital Stay (HOSPITAL_COMMUNITY)
Admission: AD | Admit: 2022-06-23 | Discharge: 2022-06-28 | DRG: 885 | Disposition: A | Discharge status: Home / Self Care | Payer: Medicaid Other | Source: Intra-hospital | Attending: Psychiatry | Admitting: Psychiatry

## 2022-06-23 DIAGNOSIS — G4700 Insomnia, unspecified: Secondary | ICD-10-CM | POA: Diagnosis present

## 2022-06-23 DIAGNOSIS — Z79899 Other long term (current) drug therapy: Secondary | ICD-10-CM | POA: Diagnosis not present

## 2022-06-23 DIAGNOSIS — K219 Gastro-esophageal reflux disease without esophagitis: Secondary | ICD-10-CM | POA: Diagnosis present

## 2022-06-23 DIAGNOSIS — Z8249 Family history of ischemic heart disease and other diseases of the circulatory system: Secondary | ICD-10-CM | POA: Diagnosis not present

## 2022-06-23 DIAGNOSIS — Z1152 Encounter for screening for COVID-19: Secondary | ICD-10-CM

## 2022-06-23 DIAGNOSIS — S51811A Laceration without foreign body of right forearm, initial encounter: Secondary | ICD-10-CM | POA: Diagnosis present

## 2022-06-23 DIAGNOSIS — Z23 Encounter for immunization: Secondary | ICD-10-CM | POA: Diagnosis not present

## 2022-06-23 DIAGNOSIS — Z9151 Personal history of suicidal behavior: Secondary | ICD-10-CM | POA: Diagnosis not present

## 2022-06-23 DIAGNOSIS — X781XXA Intentional self-harm by knife, initial encounter: Secondary | ICD-10-CM | POA: Diagnosis present

## 2022-06-23 DIAGNOSIS — F411 Generalized anxiety disorder: Secondary | ICD-10-CM | POA: Diagnosis present

## 2022-06-23 DIAGNOSIS — E669 Obesity, unspecified: Secondary | ICD-10-CM | POA: Diagnosis present

## 2022-06-23 DIAGNOSIS — F332 Major depressive disorder, recurrent severe without psychotic features: Secondary | ICD-10-CM | POA: Diagnosis present

## 2022-06-23 DIAGNOSIS — F329 Major depressive disorder, single episode, unspecified: Secondary | ICD-10-CM | POA: Diagnosis present

## 2022-06-23 DIAGNOSIS — R45851 Suicidal ideations: Secondary | ICD-10-CM

## 2022-06-23 DIAGNOSIS — Z6837 Body mass index (BMI) 37.0-37.9, adult: Secondary | ICD-10-CM | POA: Diagnosis not present

## 2022-06-23 DIAGNOSIS — Z62810 Personal history of physical and sexual abuse in childhood: Secondary | ICD-10-CM | POA: Diagnosis not present

## 2022-06-23 DIAGNOSIS — Z833 Family history of diabetes mellitus: Secondary | ICD-10-CM | POA: Diagnosis not present

## 2022-06-23 LAB — CBC WITH DIFFERENTIAL/PLATELET
Abs Immature Granulocytes: 0.02 10*3/uL (ref 0.00–0.07)
Basophils Absolute: 0 10*3/uL (ref 0.0–0.1)
Basophils Relative: 0 %
Eosinophils Absolute: 0.1 10*3/uL (ref 0.0–0.5)
Eosinophils Relative: 1 %
HCT: 36.3 % (ref 36.0–46.0)
Hemoglobin: 12.2 g/dL (ref 12.0–15.0)
Immature Granulocytes: 0 %
Lymphocytes Relative: 22 %
Lymphs Abs: 2.1 10*3/uL (ref 0.7–4.0)
MCH: 28.8 pg (ref 26.0–34.0)
MCHC: 33.6 g/dL (ref 30.0–36.0)
MCV: 85.6 fL (ref 80.0–100.0)
Monocytes Absolute: 0.8 10*3/uL (ref 0.1–1.0)
Monocytes Relative: 9 %
Neutro Abs: 6.3 10*3/uL (ref 1.7–7.7)
Neutrophils Relative %: 68 %
Platelets: 190 10*3/uL (ref 150–400)
RBC: 4.24 MIL/uL (ref 3.87–5.11)
RDW: 13.3 % (ref 11.5–15.5)
WBC: 9.4 10*3/uL (ref 4.0–10.5)
nRBC: 0 % (ref 0.0–0.2)

## 2022-06-23 LAB — COMPREHENSIVE METABOLIC PANEL
ALT: 12 U/L (ref 0–44)
AST: 15 U/L (ref 15–41)
Albumin: 4 g/dL (ref 3.5–5.0)
Alkaline Phosphatase: 56 U/L (ref 38–126)
Anion gap: 5 (ref 5–15)
BUN: 12 mg/dL (ref 6–20)
CO2: 23 mmol/L (ref 22–32)
Calcium: 9.5 mg/dL (ref 8.9–10.3)
Chloride: 109 mmol/L (ref 98–111)
Creatinine, Ser: 0.56 mg/dL (ref 0.44–1.00)
GFR, Estimated: >60 mL/min (ref >60)
Glucose, Bld: 109 mg/dL — ABNORMAL HIGH (ref 70–99)
Potassium: 4 mmol/L (ref 3.5–5.1)
Sodium: 137 mmol/L (ref 135–145)
Total Bilirubin: 0.4 mg/dL (ref 0.3–1.2)
Total Protein: 8 g/dL (ref 6.5–8.1)

## 2022-06-23 LAB — RESP PANEL BY RT-PCR (RSV, FLU A&B, COVID)  RVPGX2
Influenza A by PCR: NEGATIVE
Influenza B by PCR: NEGATIVE
Resp Syncytial Virus by PCR: NEGATIVE
SARS Coronavirus 2 by RT PCR: NEGATIVE

## 2022-06-23 LAB — ACETAMINOPHEN LEVEL: Acetaminophen (Tylenol), Serum: <10 ug/mL — ABNORMAL LOW (ref 10–30)

## 2022-06-23 LAB — HCG, QUANTITATIVE, PREGNANCY: hCG, Beta Chain, Quant, S: <1 m[IU]/mL (ref <5)

## 2022-06-23 LAB — SALICYLATE LEVEL: Salicylate Lvl: <7 mg/dL — ABNORMAL LOW (ref 7.0–30.0)

## 2022-06-23 LAB — ETHANOL: Alcohol, Ethyl (B): <10 mg/dL (ref <10)

## 2022-06-23 MED ORDER — SERTRALINE HCL 25 MG PO TABS
25.0000 mg | ORAL_TABLET | Freq: Once | ORAL | Status: DC
  Filled 2022-06-23: qty 1

## 2022-06-23 MED ORDER — LIDOCAINE HCL (PF) 1 % IJ SOLN
5.0000 mL | Freq: Once | INTRAMUSCULAR | Status: AC
  Administered 2022-06-23: 5 mL via INTRADERMAL
  Filled 2022-06-23: qty 30

## 2022-06-23 MED ORDER — ACETAMINOPHEN 325 MG PO TABS
650.0000 mg | ORAL_TABLET | Freq: Four times a day (QID) | ORAL | Status: DC | PRN
  Administered 2022-06-23 – 2022-06-27 (×5): 650 mg via ORAL
  Filled 2022-06-23 (×5): qty 2

## 2022-06-23 MED ORDER — TRAZODONE HCL 50 MG PO TABS
50.0000 mg | ORAL_TABLET | Freq: Every evening | ORAL | Status: DC | PRN
  Administered 2022-06-23 – 2022-06-25 (×3): 50 mg via ORAL
  Filled 2022-06-23 (×3): qty 1

## 2022-06-23 MED ORDER — ALUM & MAG HYDROXIDE-SIMETH 200-200-20 MG/5ML PO SUSP: 30.0000 mL | ORAL | Status: DC | PRN

## 2022-06-23 MED ORDER — MAGNESIUM HYDROXIDE 400 MG/5ML PO SUSP: 30.0000 mL | Freq: Every day | ORAL | Status: DC | PRN

## 2022-06-23 MED ORDER — HYDROXYZINE HCL 25 MG PO TABS
25.0000 mg | ORAL_TABLET | Freq: Three times a day (TID) | ORAL | Status: DC | PRN
  Administered 2022-06-23 – 2022-06-27 (×5): 25 mg via ORAL
  Filled 2022-06-23 (×5): qty 1

## 2022-06-23 MED ORDER — TETANUS-DIPHTH-ACELL PERTUSSIS 5-2.5-18.5 LF-MCG/0.5 IM SUSY
0.5000 mL | PREFILLED_SYRINGE | Freq: Once | INTRAMUSCULAR | Status: AC
  Administered 2022-06-23: 0.5 mL via INTRAMUSCULAR
  Filled 2022-06-23: qty 0.5

## 2022-06-23 MED ORDER — SERTRALINE HCL 50 MG PO TABS
50.0000 mg | ORAL_TABLET | Freq: Every day | ORAL | Status: DC
  Administered 2022-06-24 – 2022-06-26 (×3): 50 mg via ORAL
  Filled 2022-06-23 (×6): qty 1

## 2022-06-23 NOTE — Progress Notes (Signed)
Adult Psychoeducational Group Note  Date:  06/23/2022 Time:  9:29 PM  Group Topic/Focus:  AA Group  Participation Level:  Active  Participation Quality:  Appropriate  Affect:  Appropriate  Cognitive:  Appropriate  Insight: Appropriate  Engagement in Group:  Engaged  Modes of Intervention:  Education, Electronics engineer, and Support  Additional Comments:   Pt attended the AA/Wrap Up group.  Heidi [name] 06/23/2022, 9:29 PM
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
## 2022-06-23 NOTE — Progress Notes (Signed)
This Clinical research associate took report at [employer] and Actuary at [facility] we could accept patient at 1000.

## 2022-06-23 NOTE — Plan of Care (Signed)
Admission note to follow.     Problem: Education: Goal: Ability to make informed decisions regarding treatment will improve Outcome: Progressing   Problem: Coping: Goal: Coping ability will improve Outcome: Progressing   Problem: Health Behavior/Discharge Planning: Goal: Identification of resources available to assist in meeting health care needs will improve Outcome: Progressing

## 2022-06-23 NOTE — ED Notes (Signed)
Spoke with [facility] and they will be coming for patient soon.

## 2022-06-23 NOTE — Progress Notes (Signed)
Adult Psychoeducational Group Note  Date:  06/23/2022 Time:  1:56 PM  Pt did not attend orientation group.

## 2022-06-23 NOTE — ED Notes (Signed)
Called [facility] and gave report to [place]. She stated that they would not be able to take her until around 10 am. I will call [facility] close to time to arrange transportation.

## 2022-06-23 NOTE — ED Provider Notes (Signed)
[facility] DEPT Provider Note  CSN: [id number] Arrival date & time: 06/22/22 2146  Chief Complaint(s) Suicide Attempt  HPI Heidi [name] is a 21 y.o. female who presents emergency department for evaluation of suicidal ideation and self-induced wrist laceration.  Patient states that she cut herself with a steak knife and attempt to kill herself.  Patient reportedly hypotensive in route but this has resolved by the time she presents to the emergency department.  Bleeding controlled here in the emergency department.  Currently endorses persistent SI but denies HI, AH or VH.  Denies drug use.  Denies chest pain, shortness of breath abdominal pain or nausea vomiting or other systemic symptoms.   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 Authorizing Provider  Blood Pressure Monitoring (BLOOD PRESSURE KIT) DEVI 1 Device by Does not apply route as needed. 06/30/19   Tresea Mall, CNM  ferrous gluconate (FERGON) 324 MG tablet Take 1 tablet (324 mg total) by mouth daily with breakfast. Patient not taking: No sig reported 01/15/20   Orson Slick, MD  sertraline (ZOLOFT) 25 MG tablet Take 1 tablet (25 mg total) by mouth daily. 12/16/19   Starr Lake, CNM                                                                                                                                    Past Surgical History Past  Surgical History:  Procedure Laterality Date   CESAREAN SECTION N/A 10/21/2019   Procedure: CESAREAN SECTION;  Surgeon: Truett Mainland, DO;  Location: [facility];  Service: Obstetrics;  Laterality: N/A;   NO PAST SURGERIES     Family History Family History  Problem Relation Age of Onset   Diabetes Mother    Hypertension Mother     Social History Social History   Tobacco Use   Smoking status: Never   Smokeless tobacco: Never  Vaping Use   Vaping Use: Never used  Substance Use Topics   Alcohol use: No   Drug use: No   Allergies Strawberry extract  Review of Systems Review of Systems  Skin:  Positive for wound.  Psychiatric/Behavioral:  Positive for self-injury and suicidal ideas.     Physical Exam Vital Signs  I have reviewed the triage vital signs BP 138/83   Pulse 77   Temp 99.2 F (37.3 C) (Oral)   Resp  18   SpO2 97%   Physical Exam Vitals and nursing note reviewed.  Constitutional:      General: She is not in acute distress.    Appearance: She is well-developed.  HENT:     Head: Normocephalic and atraumatic.  Eyes:     Conjunctiva/sclera: Conjunctivae normal.  Cardiovascular:     Rate and Rhythm: Normal rate and regular rhythm.     Heart sounds: No murmur heard. Pulmonary:     Effort: Pulmonary effort is normal. No respiratory distress.     Breath sounds: Normal breath sounds.  Abdominal:     Palpations: Abdomen is soft.     Tenderness: There is no abdominal tenderness.  Musculoskeletal:        General: No swelling.     Cervical back: Neck supple.  Skin:    General: Skin is warm and dry.     Capillary Refill: Capillary refill takes less than 2 seconds.     Findings: Lesion present.  Neurological:     Mental Status: She is alert.  Psychiatric:        Mood and Affect: Mood normal.     ED Results and Treatments Labs (all labs ordered are listed, but only abnormal results are displayed) Labs Reviewed  CBG MONITORING, ED - Abnormal; Notable  for the following components:      Result Value   Glucose-Capillary 126 (*)    All other components within normal limits  RAPID URINE DRUG SCREEN, HOSP PERFORMED  COMPREHENSIVE METABOLIC PANEL  SALICYLATE LEVEL  ACETAMINOPHEN LEVEL  ETHANOL  CBC WITH DIFFERENTIAL/PLATELET  I-STAT BETA HCG BLOOD, ED ([place], [facility], [facility] ONLY)                                                                                                                          Radiology No results found.  Pertinent labs & imaging results that were available during my care of the patient were reviewed by me and considered in my medical decision making (see MDM for details).  Medications Ordered in ED Medications - No data to display                                                                                                                                   Procedures .Marland KitchenLaceration Repair  Date/Time: 06/23/2022 4:55 AM  Performed by: Teressa Lower, MD Authorized by: Teressa Lower, MD  Laceration details:    Location:  Shoulder/arm   Shoulder/arm location:  R lower arm   Length (cm):  5 Treatment:    Area cleansed with:  Povidone-iodine   Amount of cleaning:  Standard   Irrigation solution:  Sterile saline   Irrigation volume:  1000   Irrigation method:  Pressure wash Skin repair:    Repair method:  Sutures   Suture size:  4-0   Suture material:  Nylon   Suture technique:  Horizontal mattress   Number of sutures:  5 Approximation:    Approximation:  Close Repair type:    Repair type:  Intermediate Post-procedure details:    Dressing:  Non-adherent dressing   Procedure completion:  Tolerated well, no immediate complications   (including critical care time)  Medical Decision Making / ED Course   This patient presents to the ED for concern of suicidal ideation, self-induced laceration, this involves an extensive number of treatment options, and is a complaint that carries with it a high risk of  complications and morbidity.  The differential diagnosis includes SI, substance abuse, depression, laceration, retained foreign body, nerve injury, venous injury  MDM: Patient seen emergency room for evaluation of self-induced laceration and suicidal ideation.  Physical exam with a 5 cm laceration over the right forearm.  Bleeding controlled and neurologic exam unremarkable.  Pulses intact.  Laboratory workup reassuringly unremarkable.  X-ray with no retained foreign body.  Patient laceration repaired at bedside with nylon sutures.  Patient medically cleared for psychiatric evaluation.  TTS consult placed and disposition pending TTS.   Additional history obtained:  -External records from outside source obtained and reviewed including: Chart review including previous notes, labs, imaging, consultation notes   Lab Tests: -I ordered, reviewed, and interpreted labs.   The pertinent results include:   Labs Reviewed  CBG MONITORING, ED - Abnormal; Notable for the following components:      Result Value   Glucose-Capillary 126 (*)    All other components within normal limits  RAPID URINE DRUG SCREEN, HOSP PERFORMED  COMPREHENSIVE METABOLIC PANEL  SALICYLATE LEVEL  ACETAMINOPHEN LEVEL  ETHANOL  CBC WITH DIFFERENTIAL/PLATELET  I-STAT BETA HCG BLOOD, ED ([facility], [facility], [facility] ONLY)      EKG   EKG Interpretation  Date/Time:  Thursday June 22 2022 23:16:01 EST Ventricular Rate:  76 PR Interval:  140 QRS Duration: 84 QT Interval:  360 QTC Calculation: 405 R Axis:   65 Text Interpretation: Sinus rhythm with marked sinus arrhythmia Otherwise normal ECG No previous ECGs available No previous ECGs available Confirmed by Ezequiel [place] [phone]) on 06/22/2022 11:39:23 PM         Imaging Studies ordered: I ordered imaging studies including x-ray wrist I independently visualized and interpreted imaging. I agree with the radiologist interpretation   Medicines ordered and prescription drug  management: No orders of the defined types were placed in this encounter.   -I have reviewed the patients home medicines and have made adjustments as needed  Critical interventions none  Consultations Obtained: I requested consultation with the TTS providers,  and discussed lab and imaging findings as well as pertinent plan - they recommend: Inpatient psychiatric treatment   Cardiac Monitoring: The patient was maintained on a cardiac monitor.  I personally viewed and interpreted the cardiac monitored which showed an underlying rhythm of: NSR  Social Determinants of Health:  Factors impacting patients care include: none   Reevaluation: After the interventions noted above, I reevaluated the patient and found that  they have :improved  Co morbidities that complicate the patient evaluation  Past Medical History:  Diagnosis Date   Medical history non-contributory       Dispostion: I considered admission for this patient, and patient is medically cleared and will require psychiatric admission.     Final Clinical Impression(s) / ED Diagnoses Final diagnoses:  None     _0 @    Teressa Lower, MD 06/23/22 [phone]

## 2022-06-23 NOTE — ED Notes (Signed)
Called [facility] to give report and was told that the nurses are in a huddle and that she will have one of them call me back after the huddle.
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
## 2022-06-23 NOTE — ED Provider Notes (Signed)
Emergency Medicine Observation Re-evaluation Note  Heidi [name] is a 21 y.o. female, seen on rounds today.  Pt initially presented to the ED for complaints of Suicide Attempt Currently, the patient is awake, calm, about to make a phone call.  Physical Exam  BP 120/76   Pulse 80   Temp 98.5 F (36.9 C) (Oral)   Resp 18   SpO2 95%  Physical Exam General: NAD Cardiac: RR Lungs: even, unlabored Psych: normal affect  ED Course / MDM  EKG:EKG Interpretation  Date/Time:  Thursday June 22 2022 23:16:01 EST Ventricular Rate:  76 PR Interval:  140 QRS Duration: 84 QT Interval:  360 QTC Calculation: 405 R Axis:   65 Text Interpretation: Sinus rhythm with marked sinus arrhythmia Otherwise normal ECG No previous ECGs available No previous ECGs available Confirmed by Glynn Octave [phone]) on 06/22/2022 11:39:23 PM  I have reviewed the labs performed to date as well as medications administered while in observation.  Recent changes in the last 24 hours include none.  Plan  Current plan is for admission to [facility].    Alvira Monday, MD 06/23/22 1101
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
## 2022-06-24 NOTE — Plan of Care (Signed)
°  Problem: Education: °Goal: Ability to make informed decisions regarding treatment will improve °Outcome: Progressing °  °Problem: Health Behavior/Discharge Planning: °Goal: Identification of resources available to assist in meeting health care needs will improve °Outcome: Progressing °  °Problem: Medication: °Goal: Compliance with prescribed medication regimen will improve °Outcome: Progressing °  °
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
## 2022-06-24 NOTE — BHH Group Notes (Signed)
Goals Group 06/24/22   Group Focus: affirmation, clarity of thought, and goals/reality orientation Treatment Modality:  Psychoeducation Interventions utilized were assignment, group exercise, and support Purpose: To be able to understand and verbalize the reason for their admission to the hospital. To understand that the medication helps with their chemical imbalance but they also need to work on their choices in life. To be challenged to develop a list of 30 positives about themselves. Also introduce the concept that "feelings" are not reality.  Participation Level:  Active  Participation Quality:  Appropriate  Affect:  Appropriate  Cognitive:  Appropriate  Insight:  Improving  Engagement in Group:  Engaged  Additional Comments:  Pt rates her energy at a 4/10. Participated in the group.  Dione Housekeeper

## 2022-06-24 NOTE — Progress Notes (Signed)
   06/23/22 2124  Psych Admission Type (Psych Patients Only)  Admission Status Voluntary  Psychosocial Assessment  Patient Complaints Anxiety;Depression  Eye Contact Fair  Facial Expression Flat  Affect Anxious  Speech Logical/coherent  Interaction Assertive  Motor Activity Other (Comment) (WDL)  Appearance/Hygiene Unremarkable  Behavior Characteristics Cooperative;Anxious  Mood Depressed;Anxious  Thought Process  Coherency WDL  Content WDL  Delusions None reported or observed  Perception WDL  Hallucination None reported or observed  Judgment Poor  Confusion None  Danger to Self  Current suicidal ideation? Denies  Agreement Not to Harm Self Yes  Description of Agreement Verbal  Danger to Others  Danger to Others None reported or observed

## 2022-06-24 NOTE — BHH Group Notes (Incomplete)
.  Psychoeducational Group Note    Date:  06/24/2022 Time:1300-1400    Purpose of Group: . The group focus' on teaching patients on how to identify their needs and their Life Skills:  A group where two lists are made. What people need and what are things that we do that are unhealthy. The lists are developed by the patients and it is explained that we often do the actions that are not healthy to get our list of needs met.  Goal:: to develop the coping skills needed to get their needs met  Participation Level:  Active  Participation Quality:  Appropriate  Affect:  Appropriate  Cognitive:  Oriented  Insight: Improving  Engagement in Group:  Engaged  Modes of Intervention:  Activity, Discussion, Education, and Support  Additional Comments:    Zacherie Honeyman A    

## 2022-06-24 NOTE — Progress Notes (Signed)
   06/24/22 0555  15 Minute Checks  Location Bedroom  Visual Appearance Calm  Behavior Sleeping  Sleep (Behavioral Health Patients Only)  Calculate sleep? (Click Yes once per 24 hr at 0600 safety check) Yes  Documented sleep last 24 hours 8

## 2022-06-24 NOTE — Progress Notes (Signed)
[facility] MD Progress Note  06/24/2022 7:19 AM Heidi [name]  MRN:  [id number] Principal Problem: MDD (major depressive disorder), recurrent severe, without psychosis (HCC) Diagnosis: Principal Problem:   MDD (major depressive disorder), recurrent severe, without psychosis (HCC) Active Problems:   Obesity (BMI 30-39.9)   Suicidal ideations   Generalized anxiety disorder   Reason for Admission: Heidi [name] is a 21 yo female w/ no significant past psychiatric hx presenting voluntarily to [facility] due to suicide attempt via cutting right wrist with steak knife. This is hospitalization day 1.  Subjective:  Seen and assessed at bedside. Patient reports depression mildly improved since yesterday. Patient reports passive suicidal ideation but denies HI/AVH. Patient reports appropriate appetite and sleep.  Patient is agreeable to increasing Zoloft dose to 50 mg.  Patient denies somatic complaints from medication adjustments at this time.  All questions addressed.  Objective:  Chart Review Past 24 hours of patient's chart was reviewed.  Patient is compliant with scheduled meds. Required Agitation PRNs: none Per RN notes, no documented behavioral issues and is attending group. Patient slept, 8 hours  Total Time spent with patient: 45 minutes  Past Psychiatric History:    Past Medical History:  Past Medical History:  Diagnosis Date   Medical history non-contributory     Past Surgical History:  Procedure Laterality Date   CESAREAN SECTION N/A 10/21/2019   Procedure: CESAREAN SECTION;  Surgeon: Levie Heritage, DO;  Location: [facility] LD ORS;  Service: Obstetrics;  Laterality: N/A;   NO PAST SURGERIES     Family History:  Family History  Problem Relation Age of Onset   Diabetes Mother    Hypertension Mother    Family Psychiatric  History: no reported family psych hx Social History:  Social History   Substance and Sexual Activity  Alcohol Use No     Social History   Substance  and Sexual Activity  Drug Use No    Social History   Socioeconomic History   Marital status: Single    Spouse name: Not on file   Number of children: Not on file   Years of education: Not on file   Highest education level: Not on file  Occupational History   Not on file  Tobacco Use   Smoking status: Never   Smokeless tobacco: Never  Vaping Use   Vaping Use: Never used  Substance and Sexual Activity   Alcohol use: No   Drug use: No   Sexual activity: Yes    Partners: Female    Birth control/protection: Implant    Comment: Desires Nexplanon  Other Topics Concern   Not on file  Social History Narrative   Not on file   Social Determinants of Health   Financial Resource Strain: Not on file  Food Insecurity: No Food Insecurity (06/23/2022)   Hunger Vital Sign    Worried About Running Out of Food in the Last Year: Never true    Ran Out of Food in the Last Year: Never true  Transportation Needs: No Transportation Needs (06/23/2022)   PRAPARE - Administrator, Civil Service (Medical): No    Lack of Transportation (Non-Medical): No  Physical Activity: Not on file  Stress: Not on file  Social Connections: Not on file   Additional Social History:                         Current Medications: Current Facility-Administered Medications  Medication Dose Route Frequency  Provider Last Rate Last Admin   acetaminophen (TYLENOL) tablet 650 mg  650 mg Oral Q6H PRN Sindy Guadeloupe, NP   650 mg at 06/23/22 2124   alum & mag hydroxide-simeth (MAALOX/MYLANTA) 200-200-20 MG/5ML suspension 30 mL  30 mL Oral Q4H PRN Sindy Guadeloupe, NP       hydrOXYzine (ATARAX) tablet 25 mg  25 mg Oral TID PRN Park Pope, MD   25 mg at 06/23/22 2124   magnesium hydroxide (MILK OF MAGNESIA) suspension 30 mL  30 mL Oral Daily PRN Sindy Guadeloupe, NP       sertraline (ZOLOFT) tablet 25 mg  25 mg Oral Once Park Pope, MD       Followed by   sertraline (ZOLOFT) tablet 50 mg  50 mg Oral Daily  Park Pope, MD       traZODone (DESYREL) tablet 50 mg  50 mg Oral QHS PRN Park Pope, MD   50 mg at 06/23/22 2124    Lab Results:  No results found for this or any previous visit (from the past 24 hour(s)).  Blood Alcohol level:  Lab Results  Component Value Date   ETH <10 06/23/2022   ETH <10 01/20/2021    Metabolic Disorder Labs: Lab Results  Component Value Date   HGBA1C 5.5 01/20/2021   MPG 111.15 01/20/2021   No results found for: "PROLACTIN" Lab Results  Component Value Date   CHOL 231 (H) 01/20/2021   TRIG 41 01/20/2021   HDL 38 (L) 01/20/2021   CHOLHDL 6.1 01/20/2021   VLDL 8 01/20/2021   LDLCALC 185 (H) 01/20/2021    Physical Findings:  Musculoskeletal: Strength & Muscle Tone: within normal limits Gait & Station: normal  Psychiatric Specialty Exam:  Presentation  General Appearance:  Appropriate for Environment; Casual   Eye Contact: Minimal   Speech: Clear and Coherent; Normal Rate   Speech Volume: Normal   Handedness: Right    Mood and Affect  Mood: Depressed; Anxious   Affect: Depressed; Tearful    Thought Process  Thought Processes: Coherent; Goal Directed; Linear   Descriptions of Associations:Intact   Orientation:Full (Time, Place and Person)   Thought Content:Logical   History of Schizophrenia/Schizoaffective disorder:No   Duration of Psychotic Symptoms:No data recorded  Hallucinations:Hallucinations: None  Ideas of Reference: none  Suicidal Thoughts:Suicidal Thoughts: Yes, Passive  Homicidal Thoughts:Homicidal Thoughts: No   Sensorium  Memory: Immediate Good; Recent Good; Remote Good   Judgment: Impaired   Insight: Fair    Art therapist  Concentration: Good   Attention Span: Good   Recall: Good   Fund of Knowledge: Good   Language: Good    Psychomotor Activity  Psychomotor Activity: Psychomotor Activity: Normal   Assets  Assets:No data recorded   Sleep   Sleep: Sleep: Good    Physical Exam: ROS Blood pressure 115/73, pulse 99, temperature 98.2 F (36.8 C), temperature source Oral, resp. rate 19, height 5\' 1"  (1.549 m), weight 89.4 kg, SpO2 99 %. Body mass index is 37.22 kg/m.   ASSESSMENT AND PLAN Heidi [name] is a 21 yo female w/ no significant past psychiatric hx presenting voluntarily to [facility] due to suicide attempt via cutting right wrist with steak knife. This is hospitalization day 1.  PLAN Safety and Monitoring: Voluntary admission to inpatient psychiatric unit for safety, stabilization and treatment Daily contact with patient to assess and evaluate symptoms and progress in treatment Patient's case to be discussed in multi-disciplinary team meeting Observation Level : q15 minute checks Vital signs:  q12 hours Precautions: suicide, elopement, and assault   Psychiatric Problems Major depressive disorder-recurrent episode, severe without psychotic features Generalized anxiety disorder -INCREASE Zoloft to 50 mg for depressive symptoms and anxiety -- The risks/benefits/side-effects/alternatives to this medication were discussed in detail with the patient and time was given for questions. The patient consents to medication trial.  -- Encouraged patient to participate in unit milieu and in scheduled group therapies    3. Medical Management Right Wrist laceration s/p sutures -Outpatient follow up for suture removal -Monitor for infection -Wound dressing changes prn   PRN The following PRN medications were added to ensure patient can focus on treatment. These were discussed with patient and patient aware of ability to ask for the following medications:  -Tylenol 650 mg q6hr PRN for mild pain -Mylanta 30 ml suspension for indigestion -Milk of Magnesia 30 ml for constipation -Trazodone 50 mg qhs for insomnia -Hydroxyzine 25 mg tid PRN for anxiety   4. Discharge Planning Patient will require the following based on my  assessment:  Greatly appreciate CSW and Case management assistance with facilitating these needs and any further recommendations regarding patient's needs upon discharge. Estimated LOS: 3-5 days Discharge Concerns: Need to establish a safety plan; Medication compliance and effectiveness Discharge Goals: Return home with outpatient referrals for mental health follow-up including medication management/psychotherapy   Park Pope, MD 06/24/2022, 7:19 AM

## 2022-06-24 NOTE — Progress Notes (Signed)
[facility] Group Notes:  (Nursing/MHT/Case Management/Adjunct)  Date:  06/24/2022  Time:  2000  Type of Therapy:   wrap up group  Participation Level:  Active  Participation Quality:  Appropriate, Attentive, Sharing, and Supportive  Affect:  Depressed  Cognitive:  Alert  Insight:  Improving  Engagement in Group:  Engaged  Modes of Intervention:  Clarification, Education, and Support  Summary of Progress/Problems: Positive thinking and positive change were discussed.   Marcille Buffy 06/24/2022, 9:12 PM

## 2022-06-25 MED ORDER — PANTOPRAZOLE SODIUM 20 MG PO TBEC
20.0000 mg | DELAYED_RELEASE_TABLET | Freq: Every day | ORAL | Status: DC
  Administered 2022-06-25 – 2022-06-28 (×4): 20 mg via ORAL
  Filled 2022-06-25 (×9): qty 1

## 2022-06-25 NOTE — [facility] Group Notes (Signed)
Adult Psychoeducational Group  Date:  06/25/2022 Time:  1100-1200  Group Topic/Focus: Continuation of the group from Saturday. Looking at the lists that were created and talking about what needs to be done with the homework of 30 positives about themselves.                                     Talking about taking their power back and helping themselves to develop [name] positive self esteem.      Participation Quality:  did not attend  Heidi [name]   

## 2022-06-25 NOTE — Progress Notes (Signed)
   06/25/22 1000  Psych Admission Type (Psych Patients Only)  Admission Status Voluntary  Psychosocial Assessment  Patient Complaints Other (Comment) (Fatigue.)  Eye Contact Fair  Facial Expression Flat  Affect Appropriate to circumstance;Depressed  Speech Logical/coherent  Interaction Assertive  Motor Activity Other (Comment) (Unremarkable.)  Appearance/Hygiene Unremarkable  Behavior Characteristics Appropriate to situation  Mood Depressed  Thought Process  Coherency WDL  Content WDL  Delusions None reported or observed  Perception WDL  Hallucination None reported or observed  Judgment Limited  Confusion None  Danger to Self  Current suicidal ideation? Denies  Agreement Not to Harm Self Yes  Description of Agreement Verbal  Danger to Others  Danger to Others None reported or observed
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
## 2022-06-25 NOTE — [facility] Group Notes (Signed)
Pt attended wrap up group 

## 2022-06-25 NOTE — [facility] Group Notes (Signed)
Adult Psychoeducational Group Note Date:  06/25/2022 Time:  0900-1000 Group Topic/Focus: PROGRESSIVE RELAXATION. A group where deep breathing is taught and tensing and relaxation muscle groups is used. Imagery is used as well.  Pts are asked to imagine 3 pillars that hold them up when they are not able to hold themselves up and to share that with the group.   Participation Level:  did not attend   : Kalena Mander A   

## 2022-06-25 NOTE — Progress Notes (Signed)
[facility] MD Progress Note  06/25/2022 7:38 PM Heidi [name]  MRN:  [id number] Principal Problem: MDD (major depressive disorder), recurrent severe, without psychosis (HCC) Diagnosis: Principal Problem:   MDD (major depressive disorder), recurrent severe, without psychosis (HCC) Active Problems:   Obesity (BMI 30-39.9)   Suicidal ideations   Generalized anxiety disorder   Reason for Admission: Heidi [name] is a 20 yo female w/ no significant past psychiatric hx presenting voluntarily to [facility] due to suicide attempt via cutting right wrist with steak knife. This is hospitalization day 2.  Subjective:  Seen and assessed in milieu. Reports depression continues to somewhat improve today. Denies SI/HI/AVH today. Reports sleep and appetite appropriate. Continues to feel some anxiety. Patient states she has been experiencing postprandial heartburn. Discussed protonix and patient was agreeable. Denies bloating, colicky abdominal pain, nausea, vomiting, diarrhea, constipation. Discussed PHP as stepdown from acute inpatient. Patient is agreeable stating she feels she will need intense outpatient therapy to obtain additional coping skills.  Objective:  Chart Review Past 24 hours of patient's chart was reviewed.  Patient is compliant with scheduled meds. Required Agitation PRNs: none Per RN notes, no documented behavioral issues and is attending group. Patient slept, 8 hours  Total Time spent with patient: 45 minutes  Past Psychiatric History:    Past Medical History:  Past Medical History:  Diagnosis Date   Medical history non-contributory     Past Surgical History:  Procedure Laterality Date   CESAREAN SECTION N/A 10/21/2019   Procedure: CESAREAN SECTION;  Surgeon: Levie Heritage, DO;  Location: [facility] LD ORS;  Service: Obstetrics;  Laterality: N/A;   NO PAST SURGERIES     Family History:  Family History  Problem Relation Age of Onset   Diabetes Mother    Hypertension Mother     Family Psychiatric  History: no reported family psych hx Social History:  Social History   Substance and Sexual Activity  Alcohol Use No     Social History   Substance and Sexual Activity  Drug Use No    Social History   Socioeconomic History   Marital status: Single    Spouse name: Not on file   Number of children: Not on file   Years of education: Not on file   Highest education level: Not on file  Occupational History   Not on file  Tobacco Use   Smoking status: Never   Smokeless tobacco: Never  Vaping Use   Vaping Use: Never used  Substance and Sexual Activity   Alcohol use: No   Drug use: No   Sexual activity: Yes    Partners: Female    Birth control/protection: Implant    Comment: Desires Nexplanon  Other Topics Concern   Not on file  Social History Narrative   Not on file   Social Determinants of Health   Financial Resource Strain: Not on file  Food Insecurity: No Food Insecurity (06/23/2022)   Hunger Vital Sign    Worried About Running Out of Food in the Last Year: Never true    Ran Out of Food in the Last Year: Never true  Transportation Needs: No Transportation Needs (06/23/2022)   PRAPARE - Administrator, Civil Service (Medical): No    Lack of Transportation (Non-Medical): No  Physical Activity: Not on file  Stress: Not on file  Social Connections: Not on file   Additional Social History:  Current Medications: Current Facility-Administered Medications  Medication Dose Route Frequency Provider Last Rate Last Admin   acetaminophen (TYLENOL) tablet 650 mg  650 mg Oral Q6H PRN Sindy Guadeloupe, NP   650 mg at 06/24/22 2126   alum & mag hydroxide-simeth (MAALOX/MYLANTA) 200-200-20 MG/5ML suspension 30 mL  30 mL Oral Q4H PRN Sindy Guadeloupe, NP       hydrOXYzine (ATARAX) tablet 25 mg  25 mg Oral TID PRN Park Pope, MD   25 mg at 06/24/22 2120   magnesium hydroxide (MILK OF MAGNESIA) suspension 30 mL  30 mL  Oral Daily PRN Sindy Guadeloupe, NP       pantoprazole (PROTONIX) EC tablet 20 mg  20 mg Oral Daily Park Pope, MD       sertraline (ZOLOFT) tablet 25 mg  25 mg Oral Once Park Pope, MD       Followed by   sertraline (ZOLOFT) tablet 50 mg  50 mg Oral Daily Park Pope, MD   50 mg at 06/25/22 6226   traZODone (DESYREL) tablet 50 mg  50 mg Oral QHS PRN Park Pope, MD   50 mg at 06/24/22 2120    Lab Results:  No results found for this or any previous visit (from the past 24 hour(s)).  Blood Alcohol level:  Lab Results  Component Value Date   ETH <10 06/23/2022   ETH <10 01/20/2021    Metabolic Disorder Labs: Lab Results  Component Value Date   HGBA1C 5.5 01/20/2021   MPG 111.15 01/20/2021   No results found for: "PROLACTIN" Lab Results  Component Value Date   CHOL 231 (H) 01/20/2021   TRIG 41 01/20/2021   HDL 38 (L) 01/20/2021   CHOLHDL 6.1 01/20/2021   VLDL 8 01/20/2021   LDLCALC 185 (H) 01/20/2021    Physical Findings:  Musculoskeletal: Strength & Muscle Tone: within normal limits Gait & Station: normal  Psychiatric Specialty Exam:  Presentation  General Appearance:  Appropriate for Environment; Casual   Eye Contact: Good   Speech: Clear and Coherent; Normal Rate   Speech Volume: Normal   Handedness: Right    Mood and Affect  Mood: Anxious; Depressed   Affect: Depressed    Thought Process  Thought Processes: Coherent; Goal Directed; Linear   Descriptions of Associations:Intact   Orientation:Full (Time, Place and Person)   Thought Content:Logical   History of Schizophrenia/Schizoaffective disorder:No   Duration of Psychotic Symptoms:No data recorded  Hallucinations:Hallucinations: None   Ideas of Reference: none  Suicidal Thoughts:Suicidal Thoughts: No   Homicidal Thoughts:Homicidal Thoughts: No    Sensorium  Memory: Immediate Good; Recent Good; Remote Good   Judgment: Fair   Insight: Fair    Restaurant manager, fast food  Concentration: Good   Attention Span: Good   Recall: Good   Fund of Knowledge: Good   Language: Good    Psychomotor Activity  Psychomotor Activity: Psychomotor Activity: Normal    Assets  Assets:Communication Skills; Desire for Improvement; Physical Health    Sleep  Sleep: Sleep: Good     Physical Exam: ROS Blood pressure 124/80, pulse 92, temperature 98.7 F (37.1 C), temperature source Oral, resp. rate 16, height 5\' 1"  (1.549 m), weight 89.4 kg, SpO2 99 %. Body mass index is 37.22 kg/m.   ASSESSMENT AND PLAN Heidi [name] is a 21 yo female w/ no significant past psychiatric hx presenting voluntarily to [facility] due to suicide attempt via cutting right wrist with steak knife. This is hospitalization day 2.  PLAN Safety and Monitoring:  Voluntary admission to inpatient psychiatric unit for safety, stabilization and treatment Daily contact with patient to assess and evaluate symptoms and progress in treatment Patient's case to be discussed in multi-disciplinary team meeting Observation Level : q15 minute checks Vital signs: q12 hours Precautions: suicide, elopement, and assault   Psychiatric Problems Major depressive disorder-recurrent episode, severe without psychotic features Generalized anxiety disorder -Continue Zoloft 50 mg for depressive symptoms and anxiety -Start protonix 20 mg for GERD symptoms -- The risks/benefits/side-effects/alternatives to this medication were discussed in detail with the patient and time was given for questions. The patient consents to medication trial.  -- Encouraged patient to participate in unit milieu and in scheduled group therapies    3. Medical Management Right Wrist laceration s/p sutures Less swelling and inflammation -Outpatient follow up for suture removal -Monitor for infection -Wound dressing changes prn   PRN The following PRN medications were added to ensure patient can focus on  treatment. These were discussed with patient and patient aware of ability to ask for the following medications:  -Tylenol 650 mg q6hr PRN for mild pain -Mylanta 30 ml suspension for indigestion -Milk of Magnesia 30 ml for constipation -Trazodone 50 mg qhs for insomnia -Hydroxyzine 25 mg tid PRN for anxiety   4. Discharge Planning Patient will require the following based on my assessment:  Greatly appreciate CSW and Case management assistance with facilitating these needs and any further recommendations regarding patient's needs upon discharge. Estimated LOS: 3-5 days Discharge Concerns: Need to establish a safety plan; Medication compliance and effectiveness Discharge Goals: Return home with outpatient referrals for mental health follow-up including medication management/psychotherapy   Park Pope, MD 06/25/2022, 7:38 PM
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
## 2022-06-25 NOTE — Progress Notes (Signed)
   06/25/22 0101  Psych Admission Type (Psych Patients Only)  Admission Status Voluntary  Psychosocial Assessment  Patient Complaints Depression  Eye Contact Fair  Facial Expression Flat  Affect Appropriate to circumstance  Speech Logical/coherent  Interaction Assertive  Motor Activity Other (Comment) (WDL)  Appearance/Hygiene Unremarkable  Behavior Characteristics Appropriate to situation  Mood Depressed  Thought Process  Coherency WDL  Content WDL  Delusions None reported or observed  Perception WDL  Hallucination None reported or observed  Judgment Poor  Confusion None  Danger to Self  Current suicidal ideation? Denies  Agreement Not to Harm Self Yes  Description of Agreement verbal  Danger to Others  Danger to Others None reported or observed

## 2022-06-26 ENCOUNTER — Encounter (HOSPITAL_COMMUNITY): Payer: Self-pay

## 2022-06-26 DIAGNOSIS — F332 Major depressive disorder, recurrent severe without psychotic features: Secondary | ICD-10-CM | POA: Diagnosis not present

## 2022-06-26 MED ORDER — TRAZODONE HCL 100 MG PO TABS
100.0000 mg | ORAL_TABLET | Freq: Every day | ORAL | Status: DC
  Administered 2022-06-26 – 2022-06-27 (×2): 100 mg via ORAL
  Filled 2022-06-26 (×4): qty 1

## 2022-06-26 MED ORDER — SERTRALINE HCL 25 MG PO TABS: 25.0000 mg | ORAL_TABLET | Freq: Once | ORAL | Status: DC

## 2022-06-26 MED ORDER — SERTRALINE HCL 100 MG PO TABS: 100.0000 mg | ORAL_TABLET | Freq: Every day | ORAL | Status: DC

## 2022-06-26 MED ORDER — SERTRALINE HCL 25 MG PO TABS
25.0000 mg | ORAL_TABLET | Freq: Once | ORAL | Status: AC
  Administered 2022-06-26: 25 mg via ORAL
  Filled 2022-06-26 (×2): qty 1

## 2022-06-26 MED ORDER — MELATONIN 3 MG PO TABS
3.0000 mg | ORAL_TABLET | Freq: Every day | ORAL | Status: DC
  Administered 2022-06-26 – 2022-06-27 (×2): 3 mg via ORAL
  Filled 2022-06-26 (×4): qty 1

## 2022-06-26 MED ORDER — SERTRALINE HCL 100 MG PO TABS
100.0000 mg | ORAL_TABLET | Freq: Every day | ORAL | Status: DC
  Administered 2022-06-27 – 2022-06-28 (×2): 100 mg via ORAL
  Filled 2022-06-26 (×5): qty 1
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
## 2022-06-26 NOTE — B[facility] Group Notes (Signed)
Pt attended AA group 

## 2022-06-26 NOTE — Progress Notes (Addendum)
[facility] MD Progress Note  06/26/2022 11:01 AM Heidi [name]  MRN:  [id number] Principal Problem: MDD (major depressive disorder), recurrent severe, without psychosis ([place]) Diagnosis: Principal Problem:   MDD (major depressive disorder), recurrent severe, without psychosis ([place]) Active Problems:   Obesity (BMI 30-39.9)   Suicidal ideations   Generalized anxiety disorder   Reason for Admission: Heidi [name] is a 22 yo female w/ no significant past psychiatric hx presenting voluntarily to [facility] due to suicide attempt via cutting right wrist with steak knife. This is hospitalization day 3.  Subjective:  Seen and assessed in milieu. Reports continued depression. She states icnreased anxiety when thinking about what things she needed to work on when she leaves. Denies SI/HI/AVH today. Reports sleep and appetite appropriate. Continues to feel some anxiety. Denies any further heartburn today after starting protonix. Agreeable to increasing zoloft for continued depression.   Objective:  Chart Review Past 24 hours of patient's chart was reviewed.  Patient is compliant with scheduled meds. Required Agitation PRNs: none Per RN notes, no documented behavioral issues and is attending group. Patient slept, 8 hours  Total Time spent with patient: 45 minutes  Past Psychiatric History:    Past Medical History:  Past Medical History:  Diagnosis Date   Medical history non-contributory     Past Surgical History:  Procedure Laterality Date   CESAREAN SECTION N/A 10/21/2019   Procedure: CESAREAN SECTION;  Surgeon: Truett Mainland, DO;  Location: [facility] LD ORS;  Service: Obstetrics;  Laterality: N/A;   NO PAST SURGERIES     Family History:  Family History  Problem Relation Age of Onset   Diabetes Mother    Hypertension Mother    Family Psychiatric  History: no reported family psych hx Social History:  Social History   Substance and Sexual Activity  Alcohol Use No     Social History    Substance and Sexual Activity  Drug Use No    Social History   Socioeconomic History   Marital status: Single    Spouse name: Not on file   Number of children: Not on file   Years of education: Not on file   Highest education level: Not on file  Occupational History   Not on file  Tobacco Use   Smoking status: Never   Smokeless tobacco: Never  Vaping Use   Vaping Use: Never used  Substance and Sexual Activity   Alcohol use: No   Drug use: No   Sexual activity: Yes    Partners: Female    Birth control/protection: Implant    Comment: Desires Nexplanon  Other Topics Concern   Not on file  Social History Narrative   Not on file   Social Determinants of Health   Financial Resource Strain: Not on file  Food Insecurity: No Food Insecurity (06/23/2022)   Hunger Vital Sign    Worried About Running Out of Food in the Last Year: Never true    Ran Out of Food in the Last Year: Never true  Transportation Needs: No Transportation Needs (06/23/2022)   PRAPARE - Hydrologist (Medical): No    Lack of Transportation (Non-Medical): No  Physical Activity: Not on file  Stress: Not on file  Social Connections: Not on file   Additional Social History:                         Current Medications: Current Facility-Administered Medications  Medication Dose  Route Frequency Provider Last Rate Last Admin   acetaminophen (TYLENOL) tablet 650 mg  650 mg Oral Q6H PRN Evette Georges, NP   650 mg at 06/26/22 0930   alum & mag hydroxide-simeth (MAALOX/MYLANTA) 200-200-20 MG/5ML suspension 30 mL  30 mL Oral Q4H PRN Evette Georges, NP       hydrOXYzine (ATARAX) tablet 25 mg  25 mg Oral TID PRN France Ravens, MD   25 mg at 06/25/22 2110   magnesium hydroxide (MILK OF MAGNESIA) suspension 30 mL  30 mL Oral Daily PRN Evette Georges, NP       pantoprazole (PROTONIX) EC tablet 20 mg  20 mg Oral Daily France Ravens, MD   20 mg at 06/26/22 0740   sertraline (ZOLOFT)  tablet 25 mg  25 mg Oral Once France Ravens, MD       Followed by   sertraline (ZOLOFT) tablet 50 mg  50 mg Oral Daily France Ravens, MD   50 mg at 06/26/22 0739   traZODone (DESYREL) tablet 50 mg  50 mg Oral QHS PRN France Ravens, MD   50 mg at 06/25/22 2110    Lab Results:  No results found for this or any previous visit (from the past 24 hour(s)).  Blood Alcohol level:  Lab Results  Component Value Date   ETH <10 06/23/2022   ETH <10 12/17/7626    Metabolic Disorder Labs: Lab Results  Component Value Date   HGBA1C 5.5 01/20/2021   MPG 111.15 01/20/2021   No results found for: "PROLACTIN" Lab Results  Component Value Date   CHOL 231 (H) 01/20/2021   TRIG 41 01/20/2021   HDL 38 (L) 01/20/2021   CHOLHDL 6.1 01/20/2021   VLDL 8 01/20/2021   LDLCALC 185 (H) 01/20/2021    Physical Findings:  Musculoskeletal: Strength & Muscle Tone: within normal limits Gait & Station: normal  Psychiatric Specialty Exam:  Presentation  General Appearance:  Appropriate for Environment; Casual   Eye Contact: Good   Speech: Clear and Coherent; Normal Rate   Speech Volume: Normal   Handedness: Right    Mood and Affect  Mood: Depressed; Anxious   Affect: Depressed    Thought Process  Thought Processes: Coherent; Goal Directed; Linear   Descriptions of Associations:Intact   Orientation:Full (Time, Place and Person)   Thought Content:Logical   History of Schizophrenia/Schizoaffective disorder:No   Duration of Psychotic Symptoms:No data recorded  Hallucinations:Hallucinations: None   Ideas of Reference: none  Suicidal Thoughts:Suicidal Thoughts: No   Homicidal Thoughts:Homicidal Thoughts: No    Sensorium  Memory: Immediate Good; Recent Good; Remote Good   Judgment: Fair   Insight: Fair    Community education officer  Concentration: Fair   Attention Span: Good   Recall: Good   Fund of  Knowledge: Good   Language: Good    Psychomotor Activity  Psychomotor Activity: Psychomotor Activity: Normal    Assets  Assets:Communication Skills; Desire for Improvement; Physical Health    Sleep  Sleep: Sleep: Good     Physical Exam: Review of Systems  Respiratory:  Negative for shortness of breath.   Cardiovascular:  Negative for chest pain.  Gastrointestinal:  Negative for abdominal pain, constipation, diarrhea, heartburn, nausea and vomiting.  Neurological:  Negative for headaches.   Blood pressure 127/79, pulse (!) 107, temperature 98.9 F (37.2 C), temperature source Oral, resp. rate 16, height 5\' 1"  (1.549 m), weight 89.4 kg, SpO2 100 %. Body mass index is 37.22 kg/m.   ASSESSMENT AND PLAN Heidi [name] is  a 22 yo female w/ no significant past psychiatric hx presenting voluntarily to [facility] due to suicide attempt via cutting right wrist with steak knife. This is hospitalization day 3.  PLAN Safety and Monitoring: Voluntary admission to inpatient psychiatric unit for safety, stabilization and treatment Daily contact with patient to assess and evaluate symptoms and progress in treatment Patient's case to be discussed in multi-disciplinary team meeting Observation Level : q15 minute checks Vital signs: q12 hours Precautions: suicide, elopement, and assault   Psychiatric Problems Major depressive disorder-recurrent episode, severe without psychotic features Generalized anxiety disorder -INCREASE Zoloft to 75 mg for depressive symptoms and anxiety -Continue protonix 20 mg for GERD symptoms -- The risks/benefits/side-effects/alternatives to this medication were discussed in detail with the patient and time was given for questions. The patient consents to medication trial.  -- Encouraged patient to participate in unit milieu and in scheduled group therapies    3. Medical Management Right Wrist laceration s/p sutures Less swelling and  inflammation -Outpatient follow up for suture removal -Monitor for infection -Wound dressing changes prn -OT eval and treat for reduced grip strength and sensation    PRN The following PRN medications were added to ensure patient can focus on treatment. These were discussed with patient and patient aware of ability to ask for the following medications:  -Tylenol 650 mg q6hr PRN for mild pain -Mylanta 30 ml suspension for indigestion -Milk of Magnesia 30 ml for constipation -Trazodone 50 mg qhs for insomnia -Hydroxyzine 25 mg tid PRN for anxiety   4. Discharge Planning Patient will require the following based on my assessment:  Greatly appreciate CSW and Case management assistance with facilitating these needs and any further recommendations regarding patient's needs upon discharge. Estimated LOS: 5 days Discharge Concerns: Need to establish a safety plan; Medication compliance and effectiveness Discharge Goals: Return home with outpatient referrals for mental health follow-up including medication management/psychotherapy   France Ravens, MD 06/26/2022, 11:01 AM

## 2022-06-26 NOTE — Progress Notes (Addendum)
D: Pt denied SI/HI/AVH this morning. Pt rated her anxiety a 5/10 and her depression a 5/10. Pt reports she slept well but still felt tired this morning.  Pt complained of mild headache. Pt requested that RN change her bandage covering her arm wound. Pt has been pleasant, calm, and cooperative throughout the shift. Pt seen interacting on the unit throughout the day.   A: RN provided support and encouragement to patient. Pt given scheduled medications as prescribed. RN completed bandage change for pt. PRN Tylenol given for headache. Q15 min checks verified for safety.    R: Patient verbally contracts for safety. Patient compliant with medications and treatment plan. Patient is interacting well on the unit. Pt is safe on the unit.   06/26/22 1204  Psych Admission Type (Psych Patients Only)  Admission Status Voluntary  Psychosocial Assessment  Patient Complaints None  Eye Contact Fair  Facial Expression Sad  Affect Appropriate to circumstance  Speech Logical/coherent  Interaction Assertive  Motor Activity Other (Comment) (WDL)  Appearance/Hygiene Unremarkable  Behavior Characteristics Appropriate to situation  Mood Depressed;Anxious  Thought Process  Coherency WDL  Content WDL  Delusions None reported or observed  Perception WDL  Hallucination None reported or observed  Judgment Impaired  Confusion None  Danger to Self  Current suicidal ideation? Denies  Agreement Not to Harm Self Yes  Description of Agreement Verbal Contract  Danger to Others  Danger to Others None reported or observed

## 2022-06-26 NOTE — Progress Notes (Signed)
Patient rated her anxiety 6/10. Patient denies SI,HI, AVH. Patient was tearful when she came to the med window, pt stated that she was upset about another patient who had been doing so well and now been sent out to the hospital. RN provided emotional support and encouragement. Patient cooperative with care, received scheduled medication and PRN hydroxyzine per MAR. Q 15 minutes safety checks maintained, patient verbally contracts for safety, patient maintained safe on the unit.

## 2022-06-26 NOTE — Progress Notes (Signed)
   06/26/22 0600  15 Minute Checks  Location Bedroom  Visual Appearance Calm  Behavior Sleeping  Sleep (Behavioral Health Patients Only)  Calculate sleep? (Click Yes once per 24 hr at 0600 safety check) Yes  Documented sleep last 24 hours 8.25

## 2022-06-26 NOTE — BHH Group Notes (Signed)
[facility] Group Notes:  (Nursing/MHT/Case Management/Adjunct)  Date:  06/26/2022  Time:  9:46 AM  Type of Therapy:  Group Therapy  Participation Level:  Active  Participation Quality:  Appropriate  Affect:  Appropriate  Cognitive:  Appropriate  Insight:  Good  Engagement in Group:  Engaged  Modes of Intervention:  Socialization  Summary of Progress/Problems:  Chase Picket 06/26/2022, 9:46 AM

## 2022-06-27 DIAGNOSIS — F332 Major depressive disorder, recurrent severe without psychotic features: Secondary | ICD-10-CM | POA: Diagnosis not present

## 2022-06-27 NOTE — Progress Notes (Signed)
Adult Psychoeducational Group Note  Date:  06/27/2022 Time:  9:14 PM  Group Topic/Focus:  Wrap-Up Group:   The focus of this group is to help patients review their daily goal of treatment and discuss progress on daily workbooks.  Participation Level:  Active  Participation Quality:  Appropriate  Affect:  Appropriate  Cognitive:  Appropriate  Insight: Appropriate  Engagement in Group:  Engaged  Modes of Intervention:  Discussion  Additional Comments:  Pt states that she was frustrated for one of her friends on the unit today. Pt states she was able to be social and participate in programming. Pt denies everything. Pt wants to learn how to set better boundaries   Gerhard Perches 06/27/2022, 9:14 PM

## 2022-06-27 NOTE — Group Note (Signed)
LCSW Group Therapy Note   Group Date: 06/27/2022 Start Time: 1100 End Time: 1200   Type of Therapy and Topic:  Group Therapy: Boundaries  Participation Level:  Active  Description of Group: This group will address the use of boundaries in their personal lives. Patients will explore why boundaries are important, the difference between healthy and unhealthy boundaries, and negative and postive outcomes of different boundaries and will look at how boundaries can be crossed.  Patients will be encouraged to identify current boundaries in their own lives and identify what kind of boundary is being set. Facilitators will guide patients in utilizing problem-solving interventions to address and correct types boundaries being used and to address when no boundary is being used. Understanding and applying boundaries will be explored and addressed for obtaining and maintaining a balanced life. Patients will be encouraged to explore ways to assertively make their boundaries and needs known to significant others in their lives, using other group members and facilitator for role play, support, and feedback.  Therapeutic Goals:  1.  Patient will identify areas in their life where setting clear boundaries could be  used to improve their life.  2.  Patient will identify signs/triggers that a boundary is not being respected. 3.  Patient will identify two ways to set boundaries in order to achieve balance in  their lives: 4.  Patient will demonstrate ability to communicate their needs and set boundaries  through discussion and/or role plays  Summary of Patient Progress:  Lamiya was actively engaged throughout the session and proved open to feedback from [place] and peers. Patient demonstrated appropriate insight into the subject matter, was respectful of peers, and was present throughout the entire session.  Therapeutic Modalities:   Cognitive Behavioral Therapy Solution-Focused Therapy  Windle Guard,  LCSW 06/27/2022  3:23 PM

## 2022-06-27 NOTE — Plan of Care (Signed)
  Problem: Education: Goal: Ability to make informed decisions regarding treatment will improve Outcome: Progressing   Problem: Coping: Goal: Coping ability will improve Outcome: Progressing   Problem: Self-Concept: Goal: Ability to disclose and discuss suicidal ideas will improve Outcome: Progressing Goal: Will verbalize positive feelings about self Outcome: Progressing   Problem: Education: Goal: Emotional status will improve Outcome: Progressing   Problem: Health Behavior/Discharge Planning: Goal: Compliance with treatment plan for underlying cause of condition will improve Outcome: Progressing

## 2022-06-27 NOTE — Evaluation (Signed)
Occupational Therapy Evaluation Patient Details Name: Heidi [name] MRN: [id number] DOB: Jan 15, 2001 Today's Date: 06/27/2022   History of Present Illness    Heidi [name] is a 22 yo female w/ no significant past psychiatric hx presenting voluntarily to [facility] due to suicide attempt via cutting right wrist with steak knife. This is hospitalization day 3.    Clinical Impression   Pt presents w/ R UE / wrist laceration resulting in hospitalization and sutures / OT was consulted to assess c/o decreased MMT and sensation of the R hand and wrist. OT performed sensory testing where pt reports diminished LT sensation along the lateral antebrachial cutaneous nerve distribution. Surrounding musculocutaneous nerve distribution appears to be intact as does motor distribution. She does have limited supination and thumb manipulation, however this is d/t pain and discomfort rather than damaged nerve / muscle tissue as she is able to resist 3-3+/5 of MMT in these areas. All gross motor ROM is intact and w/o impairment. ADLs are INDP w/ additional time / OT educates pt on P/AROM to promote healing. Pt is looking forward to DC tomorrow. She is also receptive to [facility] s/p DC. OT will now sign off at this time.      Recommendations for follow up therapy are one component of a multi-disciplinary discharge planning process, led by the attending physician.  Recommendations may be updated based on patient status, additional functional criteria and insurance authorization.   Follow Up Recommendations  Outpatient OT     Assistance Recommended at Discharge PRN (per pt request)     Functional Status Assessment  Patient has not had a recent decline in their functional status               Precautions / Restrictions Restrictions Other Position/Activity Restrictions: R UE / wrist laceration / OT does not uncover any established precautions / OT discussed the improtance of passive and active range of motion in all  degrees of freedom controling for pain /      Mobility Bed Mobility Overal bed mobility: Independent                  Transfers Overall transfer level: Independent                        Balance Overall balance assessment: Independent                                         ADL either performed or assessed with clinical judgement   ADL Overall ADL's : Independent                                             Vision Baseline Vision/History: 1 Wears glasses Ability to See in Adequate Light: 0 Adequate Patient Visual Report: No change from baseline                  Pertinent Vitals/Pain Pain Assessment Pain Assessment: No/denies pain     Hand Dominance Right   Extremity/Trunk Assessment Upper Extremity Assessment Upper Extremity Assessment:  (R hand and wrist deficits in grasp and supination / thumb ROM d/t laceration / tolerates PROM well)   Lower Extremity Assessment Lower Extremity Assessment: Overall WFL for tasks assessed  Communication Communication Communication: No difficulties   Cognition Arousal/Alertness: Awake/alert Behavior During Therapy: WFL for tasks assessed/performed Overall Cognitive Status: Within Functional Limits for tasks assessed                                       General Comments       Exercises  A/PROM of R UE / hand and wrist to promote supination and pronation / thumb ROM / grasp and [place] of digit 1-5         Home Living Family/patient expects to be discharged to:: Private residence Living Arrangements: Parent;Children;Other relatives                                      Prior Functioning/Environment Prior Level of Function : Independent/Modified Independent             Mobility Comments: WNL ADLs Comments: WNL        OT Problem List: Decreased strength;Impaired UE functional use;Impaired sensation;Decreased range of  motion       AM-PAC OT "6 Clicks" Daily Activity     Outcome Measure Help from another person eating meals?: None Help from another person taking care of personal grooming?: None Help from another person toileting, which includes using toliet, bedpan, or urinal?: None Help from another person bathing (including washing, rinsing, drying)?: None Help from another person to put on and taking off regular upper body clothing?: None Help from another person to put on and taking off regular lower body clothing?: None 6 Click Score: 24   End of Session Nurse Communication: Other (comment) (R UE status)  Activity Tolerance: Patient tolerated treatment well Patient left: in chair  OT Visit Diagnosis: Muscle weakness (generalized) (M62.81)                Time: 0388-8280 OT Time Calculation (min): 38 min Charges:  OT General Charges $OT Visit: 1 Visit OT Evaluation $OT Eval Low Complexity: 1 Low OT Treatments $Therapeutic Activity: 8-22 mins $Therapeutic Exercise: 8-22 mins  Cornell Barman, OT   Brantley Stage 06/27/2022, 7:48 PM

## 2022-06-27 NOTE — BHH Group Notes (Signed)
Pt attended group and participated in discussion. 

## 2022-06-27 NOTE — Progress Notes (Signed)
   06/27/22 0644  15 Minute Checks  Location Bedroom  Visual Appearance Calm  Behavior Composed  Sleep (Behavioral Health Patients Only)  Calculate sleep? (Click Yes once per 24 hr at 0600 safety check) Yes  Documented sleep last 24 hours 8

## 2022-06-27 NOTE — Progress Notes (Signed)
[facility] MD Progress Note  06/27/2022 1:40 PM Heidi [name]  MRN:  [id number] Principal Problem: MDD (major depressive disorder), recurrent severe, without psychosis (HCC) Diagnosis: Principal Problem:   MDD (major depressive disorder), recurrent severe, without psychosis (HCC) Active Problems:   Obesity (BMI 30-39.9)   Suicidal ideations   Generalized anxiety disorder   Reason for Admission: Heidi [name] is a 22 yo female w/ no significant past psychiatric hx presenting voluntarily to [facility] due to suicide attempt via cutting right wrist with steak knife. This is hospitalization day 4.  Subjective:  Seen and assessed in milieu. Reports feeling more depressed today after a patient on the unit she has been socializing with attempted to drink hand sanitizer.  Patient also felt very helpless when one of the other people that she spoke with on the unit had a panic attack. She felt anger towards the patient that had attempted to drink hand sanitizer as patient thought the other patient had been doing well. She states overall she feels that her coping skills have greatly helped with her anxiety and depression.  She states that the as needed hydroxyzine was beneficial to allowing her to not have racing thoughts and ruminations after yesterday's events with other patients. Denies SI/HI/AVH today. Reports sleep and appetite appropriate.  Patient agrees to further increases in Zoloft.  I discussed with her that to therapy she will obtain more coping skills allowing her to better manage her anxiety, mood lability, and depression.  Patient reports still feeling that she will be ready to be discharged tomorrow.  I discussed with her that we would ensure that she would have PHP to better aid with therapy and coping skills.  Patient verbalized understanding and no other questions at this time.  Objective:  Chart Review Past 24 hours of patient's chart was reviewed.  Patient is compliant with scheduled  meds. Required Agitation PRNs: none Per RN notes, no documented behavioral issues and is attending group. Patient slept, 8 hours  Total Time spent with patient: 45 minutes  Past Psychiatric History:    Past Medical History:  Past Medical History:  Diagnosis Date   Medical history non-contributory     Past Surgical History:  Procedure Laterality Date   CESAREAN SECTION N/A 10/21/2019   Procedure: CESAREAN SECTION;  Surgeon: Levie Heritage, DO;  Location: [facility] LD ORS;  Service: Obstetrics;  Laterality: N/A;   NO PAST SURGERIES     Family History:  Family History  Problem Relation Age of Onset   Diabetes Mother    Hypertension Mother    Family Psychiatric  History: no reported family psych hx Social History:  Social History   Substance and Sexual Activity  Alcohol Use No     Social History   Substance and Sexual Activity  Drug Use No    Social History   Socioeconomic History   Marital status: Single    Spouse name: Not on file   Number of children: Not on file   Years of education: Not on file   Highest education level: Not on file  Occupational History   Not on file  Tobacco Use   Smoking status: Never   Smokeless tobacco: Never  Vaping Use   Vaping Use: Never used  Substance and Sexual Activity   Alcohol use: No   Drug use: No   Sexual activity: Yes    Partners: Female    Birth control/protection: Implant    Comment: Desires Nexplanon  Other Topics Concern  Not on file  Social History Narrative   Not on file   Social Determinants of Health   Financial Resource Strain: Not on file  Food Insecurity: No Food Insecurity (06/23/2022)   Hunger Vital Sign    Worried About Running Out of Food in the Last Year: Never true    Ran Out of Food in the Last Year: Never true  Transportation Needs: No Transportation Needs (06/23/2022)   PRAPARE - Hydrologist (Medical): No    Lack of Transportation (Non-Medical): No  Physical  Activity: Not on file  Stress: Not on file  Social Connections: Not on file   Additional Social History:                         Current Medications: Current Facility-Administered Medications  Medication Dose Route Frequency Provider Last Rate Last Admin   acetaminophen (TYLENOL) tablet 650 mg  650 mg Oral Q6H PRN Evette Georges, NP   650 mg at 06/26/22 0930   alum & mag hydroxide-simeth (MAALOX/MYLANTA) 200-200-20 MG/5ML suspension 30 mL  30 mL Oral Q4H PRN Evette Georges, NP       hydrOXYzine (ATARAX) tablet 25 mg  25 mg Oral TID PRN France Ravens, MD   25 mg at 06/26/22 2149   magnesium hydroxide (MILK OF MAGNESIA) suspension 30 mL  30 mL Oral Daily PRN Evette Georges, NP       melatonin tablet 3 mg  3 mg Oral QHS Massengill, Nathan, MD   3 mg at 06/26/22 2148   pantoprazole (PROTONIX) EC tablet 20 mg  20 mg Oral Daily France Ravens, MD   20 mg at 06/27/22 0839   sertraline (ZOLOFT) tablet 100 mg  100 mg Oral Daily France Ravens, MD   100 mg at 06/27/22 4650   traZODone (DESYREL) tablet 100 mg  100 mg Oral QHS Massengill, Ovid Curd, MD   100 mg at 06/26/22 2149   traZODone (DESYREL) tablet 50 mg  50 mg Oral QHS PRN France Ravens, MD   50 mg at 06/25/22 2110    Lab Results:  No results found for this or any previous visit (from the past 24 hour(s)).  Blood Alcohol level:  Lab Results  Component Value Date   ETH <10 06/23/2022   ETH <10 35/46/5681    Metabolic Disorder Labs: Lab Results  Component Value Date   HGBA1C 5.5 01/20/2021   MPG 111.15 01/20/2021   No results found for: "PROLACTIN" Lab Results  Component Value Date   CHOL 231 (H) 01/20/2021   TRIG 41 01/20/2021   HDL 38 (L) 01/20/2021   CHOLHDL 6.1 01/20/2021   VLDL 8 01/20/2021   LDLCALC 185 (H) 01/20/2021    Physical Findings:  Musculoskeletal: Strength & Muscle Tone: within normal limits Gait & Station: normal  Psychiatric Specialty Exam:  Presentation  General Appearance:  Appropriate for Environment;  Casual   Eye Contact: Good   Speech: Clear and Coherent; Normal Rate   Speech Volume: Normal   Handedness: Right    Mood and Affect  Mood: Depressed; Anxious   Affect: Depressed    Thought Process  Thought Processes: Coherent; Goal Directed; Linear   Descriptions of Associations:Intact   Orientation:Full (Time, Place and Person)   Thought Content:Logical   History of Schizophrenia/Schizoaffective disorder:No   Duration of Psychotic Symptoms:No data recorded  Hallucinations:Hallucinations: None   Ideas of Reference: none  Suicidal Thoughts:Suicidal Thoughts: No   Homicidal Thoughts:Homicidal Thoughts:  No    Sensorium  Memory: Immediate Good; Recent Good; Remote Good   Judgment: Fair   Insight: Fair    Community education officer  Concentration: Fair   Attention Span: Good   Recall: Good   Fund of Knowledge: Good   Language: Good    Psychomotor Activity  Psychomotor Activity: Psychomotor Activity: Normal    Assets  Assets:Communication Skills; Desire for Improvement; Physical Health    Sleep  Sleep: Sleep: Good     Physical Exam: Review of Systems  Respiratory:  Negative for shortness of breath.   Cardiovascular:  Negative for chest pain.  Gastrointestinal:  Negative for abdominal pain, constipation, diarrhea, heartburn, nausea and vomiting.  Neurological:  Negative for headaches.   Blood pressure 120/81, pulse (!) 104, temperature 98.1 F (36.7 C), temperature source Oral, resp. rate 18, height 5\' 1"  (1.549 m), weight 89.4 kg, SpO2 100 %. Body mass index is 37.22 kg/m.   ASSESSMENT AND PLAN Heidi [name] is a 22 yo female w/ no significant past psychiatric hx presenting voluntarily to [facility] due to suicide attempt via cutting right wrist with steak knife. This is hospitalization day 4.  PLAN Safety and Monitoring: Voluntary admission to inpatient psychiatric unit for safety, stabilization and  treatment Daily contact with patient to assess and evaluate symptoms and progress in treatment Patient's case to be discussed in multi-disciplinary team meeting Observation Level : q15 minute checks Vital signs: q12 hours Precautions: suicide, elopement, and assault   Psychiatric Problems Major depressive disorder-recurrent episode, severe without psychotic features Generalized anxiety disorder -INCREASE Zoloft to 100 mg for depressive symptoms and anxiety -Continue protonix 20 mg for GERD symptoms -- The risks/benefits/side-effects/alternatives to this medication were discussed in detail with the patient and time was given for questions. The patient consents to medication trial.  -- Encouraged patient to participate in unit milieu and in scheduled group therapies    3. Medical Management Right Wrist laceration s/p sutures Less swelling and inflammation -Outpatient follow up for suture removal -Monitor for infection -Wound dressing changes prn -OT eval and treat for reduced grip strength and sensation    PRN The following PRN medications were added to ensure patient can focus on treatment. These were discussed with patient and patient aware of ability to ask for the following medications:  -Tylenol 650 mg q6hr PRN for mild pain -Mylanta 30 ml suspension for indigestion -Milk of Magnesia 30 ml for constipation -Trazodone 50 mg qhs for insomnia -Hydroxyzine 25 mg tid PRN for anxiety   4. Discharge Planning Patient will require the following based on my assessment:  Greatly appreciate CSW and Case management assistance with facilitating these needs and any further recommendations regarding patient's needs upon discharge. Estimated LOS: 5 days Discharge Concerns: Need to establish a safety plan; Medication compliance and effectiveness Discharge Goals: Return home with outpatient referrals for mental health follow-up including medication management/psychotherapy   France Ravens,  MD 06/27/2022, 1:40 PM

## 2022-06-27 NOTE — Progress Notes (Signed)
D: Pt denied SI/HI/AVH this morning. Pt rated her depression a 3/10, anxiety a 0/10, and feelings of hopelessness a 0/10. Pt reports 6/10 pain in her arm related to her self arm cut that is healing. Pt requested for bandage change on arm, RN completed bandage change. Pt interacting well on the unit throughout the day, participating in groups. Pt has been pleasant, calm, and cooperative throughout the shift.   A: RN provided support and encouragement to patient. Pt given scheduled medications as prescribed. Q15 min checks verified for safety.    R: Patient verbally contracts for safety. Patient compliant with medications and treatment plan. Patient is interacting well on the unit. Pt is safe on the unit.   06/27/22 1000  Psych Admission Type (Psych Patients Only)  Admission Status Voluntary  Psychosocial Assessment  Patient Complaints Depression;Sadness  Eye Contact Fair  Facial Expression Sad  Affect Depressed  Speech Logical/coherent;Soft  Interaction Assertive  Motor Activity Slow  Appearance/Hygiene Unremarkable  Behavior Characteristics Appropriate to situation  Mood Depressed;Sad  Thought Process  Coherency WDL  Content WDL  Delusions None reported or observed  Perception WDL  Hallucination None reported or observed  Judgment Impaired  Confusion None  Danger to Self  Current suicidal ideation? Denies  Agreement Not to Harm Self Yes  Description of Agreement Pt verbally contracts for safety  Danger to Others  Danger to Others None reported or observed

## 2022-06-27 NOTE — Progress Notes (Signed)
Patient reports low mood in the morning, stated but  "It got better now." Patient reports zero depression and anxiety this evening, she denies SI, HI, & AVH. Patient complaints of pain "from my right arm cut" rated pain 6/10,  PRN Tylenol administered at 2128 per MAR. Patient tolerates scheduled HS medications without side effects. Emotional support and encouragement provided to patient, patient attended group and interacted with other patients and staff members on the unit. Q 15 minutes safety checks ongoing, patient remains safe on the unit.

## 2022-06-27 NOTE — BHH Suicide Risk Assessment (Signed)
[place] INPATIENT:  Family/Significant Other Suicide Prevention Education  Suicide Prevention Education:  Education Completed; 06-27-2022,   [name] [phone] has been identified by the patient as the family member/significant other with whom the patient will be residing, and identified as the person(s) who will aid the patient in the event of a mental health crisis (suicidal ideations/suicide attempt).  With written consent from the patient, the family member/significant other has been provided the following suicide prevention education, prior to the and/or following the discharge of the patient.  The suicide prevention education provided includes the following: Suicide risk factors Suicide prevention and interventions National Suicide Hotline telephone number [facility] assessment telephone number [facility] Emergency Assistance [place] and/or Residential Mobile Crisis Unit telephone number  Request made of family/significant other to: Remove weapons (e.g., guns, rifles, knives), all items previously/currently identified as safety concern.   Remove drugs/medications (over-the-counter, prescriptions, illicit drugs), all items previously/currently identified as a safety concern.  [name] [phone] verbalizes understanding of the suicide prevention education information provided.  The family member/significant other agrees to remove the items of safety concern listed above.  Delight Bickle S Billyjack Trompeter 06/27/2022, 10:39 AM

## 2022-06-28 DIAGNOSIS — F332 Major depressive disorder, recurrent severe without psychotic features: Principal | ICD-10-CM

## 2022-06-28 MED ORDER — HYDROXYZINE HCL 25 MG PO TABS: 25.0000 mg | ORAL_TABLET | Freq: Three times a day (TID) | ORAL | 0 refills | Status: DC | PRN

## 2022-06-28 MED ORDER — TRAZODONE HCL 100 MG PO TABS: 100.0000 mg | ORAL_TABLET | Freq: Every day | ORAL | 0 refills | Status: DC

## 2022-06-28 MED ORDER — SERTRALINE HCL 100 MG PO TABS: 100.0000 mg | ORAL_TABLET | Freq: Every day | ORAL | 0 refills | Status: DC

## 2022-06-28 MED ORDER — MELATONIN 3 MG PO TABS: 3.0000 mg | ORAL_TABLET | Freq: Every day | ORAL | 0 refills | Status: DC

## 2022-06-28 MED ORDER — PANTOPRAZOLE SODIUM 20 MG PO TBEC: 20.0000 mg | DELAYED_RELEASE_TABLET | Freq: Every day | ORAL | 0 refills | Status: DC

## 2022-06-28 NOTE — BHH Suicide Risk Assessment (Addendum)
[facility] Discharge Suicide Risk Assessment   Principal Problem: MDD (major depressive disorder), recurrent severe, without psychosis ([place]) Discharge Diagnoses: Principal Problem:   MDD (major depressive disorder), recurrent severe, without psychosis ([place]) Active Problems:   Obesity (BMI 30-39.9)   Suicidal ideations   Generalized anxiety disorder   Reason for Admission: Heidi [name] is a 22 yo female w/ no significant past psychiatric hx presenting voluntarily to [facility] due to suicide attempt via cutting right wrist with steak knife.   Hospital Summary During the patient's hospitalization, patient had extensive initial psychiatric evaluation, and follow-up psychiatric evaluations every day.  Psychiatric diagnoses provided upon initial assessment:  Major depressive disorder-recurrent episode, severe without psychotic features Generalized anxiety disorder  Patient's psychiatric medications were adjusted on admission:  -Start Zoloft 25 mg for depressive symptoms and anxiety  -Hydroxyzine 25 mg tid PRN for anxiety  During the hospitalization, other adjustments were made to the patient's psychiatric medication regimen:  -increased zoloft to 100 mg for refractory depression -Start trazodone 50 mg titrated up to 100 mg for insomnia -Started pantoprazole 20 mg for GERD symptoms  Gradually, patient started adjusting to milieu.   Patient's care was discussed during the interdisciplinary team meeting every day during the hospitalization.  The patient denies having side effects to prescribed psychiatric medication.  The patient reports their target psychiatric symptoms of depress responded well to the psychiatric medications, and the patient reports overall benefit other psychiatric hospitalization. Supportive psychotherapy was provided to the patient. The patient also participated in regular group therapy while admitted.   Labs were reviewed with the patient, and abnormal results were  discussed with the patient.  The patient denied having suicidal thoughts more than 48 hours prior to discharge.  Patient denies having homicidal thoughts.  Patient denies having auditory hallucinations.  Patient denies any visual hallucinations.  Patient denies having paranoid thoughts.  The patient is able to verbalize their individual safety plan to this provider.  It is recommended to the patient to continue psychiatric medications as prescribed, after discharge from the hospital.    It is recommended to the patient to follow up with your outpatient psychiatric provider and PCP.  Discussed with the patient, the impact of alcohol, drugs, tobacco have been there overall psychiatric and medical wellbeing, and total abstinence from substance use was recommended the patient.   Total Time spent with patient: 45 minutes  Musculoskeletal: Strength & Muscle Tone: within normal limits Gait & Station: normal Patient leans: N/A  Psychiatric Specialty Exam  Presentation  General Appearance: Appropriate for Environment; Casual   Eye Contact:Good   Speech:Clear and Coherent; Normal Rate   Speech Volume:Normal   Handedness:Right    Mood and Affect  Mood:Euthymic   Duration of Depression Symptoms: Greater than two weeks   Affect:Appropriate; Congruent    Thought Process  Thought Processes:Coherent; Goal Directed; Linear   Descriptions of Associations:Intact   Orientation:Full (Time, Place and Person)   Thought Content:Logical   History of Schizophrenia/Schizoaffective disorder:  Hallucinations:Hallucinations: None  Ideas of Reference:None   Suicidal Thoughts:Suicidal Thoughts: No  Homicidal Thoughts:Homicidal Thoughts: No   Sensorium  Memory:Immediate Good; Recent Good; Remote Good   Judgment:Fair   Insight:Fair    Executive Functions  Concentration:Good   Attention Span:Good   [place] of  Knowledge:Good   Language:Good    Psychomotor Activity  Psychomotor Activity:Psychomotor Activity: Normal   Assets  Assets:Communication Skills; Desire for Improvement; Physical Health    Sleep  Sleep:Sleep: Good   Physical Exam:  Physical Exam Vitals and nursing note reviewed.  Constitutional:      Appearance: Normal appearance. She is obese.  HENT:     Head: Normocephalic and atraumatic.  Pulmonary:     Effort: Pulmonary effort is normal.  Neurological:     General: No focal deficit present.     Mental Status: She is oriented to person, place, and time.    Review of Systems  Respiratory:  Negative for shortness of breath.   Cardiovascular:  Negative for chest pain.  Gastrointestinal:  Negative for abdominal pain, constipation, diarrhea, heartburn, nausea and vomiting.  Neurological:  Negative for headaches.   Blood pressure 112/73, pulse 82, temperature 98.4 F (36.9 C), temperature source Oral, resp. rate 20, height 5\' 1"  (1.549 m), weight 89.4 kg, SpO2 100 %. Body mass index is 37.22 kg/m.  Mental Status Per Nursing Assessment::   On Admission:  Self-harm behaviors  Demographic Factors:  Adolescent or young adult  Loss Factors: NA  Historical Factors: Impulsivity  Risk Reduction Factors:   Responsible for children under 91 years of age, Sense of responsibility to family, Employed, Positive social support, Positive therapeutic relationship, and Positive coping skills or problem solving skills  Continued Clinical Symptoms:  Severe Anxiety and/or Agitation More than one psychiatric diagnosis Previous Psychiatric Diagnoses and Treatments  Cognitive Features That Contribute To Risk:  None    Suicide Risk:  Mild:  Suicidal ideation of limited frequency, intensity, duration, and specificity.  There are no identifiable plans, no associated intent, mild dysphoria and related symptoms, good self-control (both objective and subjective assessment), few other  risk factors, and identifiable protective factors, including available and accessible social support.   [facility] Follow up on 06/29/2022.   Specialty: Behavioral Health Why: You are scheduled for an assessment for the PHP on 06/29/22 @ 10a. This appointment will last approximately one hour and will be virtual via Webex. PHP is virtual group therapy that runs Mon-Fri from 9am-1pm. Please download the [employer] app prior to the appointment. If you need to cancel or reschedule, please call [phone] Contact information: [place] [phone]                Plan Of Care/Follow-up recommendations:  Activity: as tolerated  Diet: heart healthy  Other: -Follow-up with your outpatient psychiatric provider -instructions on appointment date, time, and address (location) are provided to you in discharge paperwork.  -Take your psychiatric medications as prescribed at discharge - instructions are provided to you in the discharge paperwork  -Follow-up with outpatient primary care doctor and other specialists -for management of chronic medical disease, including: thromboyctopenia, alpha thalassemia carrier  -Testing: Follow-up with outpatient provider for abnormal lab results: none  -Please follow up with a PCP to evaluate your right wrist injury for proper wound healing  -Recommend abstinence from alcohol, tobacco, and other illicit drug use at discharge.   -If your psychiatric symptoms recur, worsen, or if you have side effects to your psychiatric medications, call your outpatient psychiatric provider, 911, 988 or go to the nearest emergency department.  -If suicidal thoughts recur, call your outpatient psychiatric provider, 911, 988 or go to the nearest emergency department.   France Ravens, MD 06/28/2022, 9:06 AM

## 2022-06-28 NOTE — Discharge Instructions (Signed)
-  Follow-up with your outpatient psychiatric provider -instructions on appointment date, time, and address (location) are provided to you in discharge paperwork.  -Take your psychiatric medications as prescribed at discharge - instructions are provided to you in the discharge paperwork  -Follow-up with outpatient primary care doctor and other specialists -for management of preventative medicine and any chronic medical disease.  -Recommend abstinence from alcohol, tobacco, and other illicit drug use at discharge.   -If your psychiatric symptoms recur, worsen, or if you have side effects to your psychiatric medications, call your outpatient psychiatric provider, 911, [phone] or go to the nearest emergency department.  -If suicidal thoughts occur, call your outpatient psychiatric provider, 911, [phone] or go to the nearest emergency department.  Naloxone (Narcan) can help reverse an overdose when given to the victim quickly.  [place] offers free naloxone kits and instructions/training on its use.  Add naloxone to your first aid kit and you can help save a life.   Pick up your free kit at the following locations:   [place]:  [facility], [address] [place] [address] ([phone]) [facility] [address] [place] [id number] ([phone]) [facility] [address] [place] [address]  [place]: [facility] [address] [place] [address] ([phone]) [facility] [address] [place] [address] ([phone])  

## 2022-06-28 NOTE — Progress Notes (Signed)
   06/28/22 0630  15 Minute Checks  Location Bedroom  Visual Appearance Calm  Behavior Composed  Sleep (Behavioral Health Patients Only)  Calculate sleep? (Click Yes once per 24 hr at 0600 safety check) Yes  Documented sleep last 24 hours 7.25

## 2022-06-28 NOTE — Discharge Summary (Signed)
Physician Discharge Summary Note  Patient:  Heidi [name] is an 22 y.o., female MRN:  [id number] DOB:  January 26, 2001 Patient phone:  [phone] (home)  Patient address:   [address] [place] [address],  Total Time spent with patient: 45 minutes  Date of Admission:  06/23/2022 Date of Discharge: 06/28/2022  Reason for Admission:  Heidi [name] is a 22 yo female w/ no significant past psychiatric hx presenting voluntarily to [facility] due to suicide attempt via cutting right wrist with steak knife.   Principal Problem: MDD (major depressive disorder), recurrent severe, without psychosis ([place]) Discharge Diagnoses: Principal Problem:   MDD (major depressive disorder), recurrent severe, without psychosis ([place]) Active Problems:   Obesity (BMI 30-39.9)   Suicidal ideations   Generalized anxiety disorder    Past Psychiatric Hx: Previous Psych Diagnoses: denies Prior inpatient treatment: was supposed to be admitted last year but got COVID so did not go inpatient Current/prior outpatient treatment: 2 years ago Psychotherapy hx: "ok", does not feel it was effective History of suicide: middle school cut wrist, high school cut wrist History of homicide: denies Psychiatric medication history: zoloft 25 mg (patient took PRN rather than daily as prescribed after pregnancy) Psychiatric medication compliance history: n/a Neuromodulation history: denies Current Psychiatrist: denies Current therapist: denies  Past Medical History:  Past Medical History:  Diagnosis Date   Medical history non-contributory     Past Surgical History:  Procedure Laterality Date   CESAREAN SECTION N/A 10/21/2019   Procedure: CESAREAN SECTION;  Surgeon: Truett Mainland, DO;  Location: [facility] LD ORS;  Service: Obstetrics;  Laterality: N/A;   NO PAST SURGERIES     Family History:  Family History  Problem Relation Age of Onset   Diabetes Mother    Hypertension Mother    Family Psychiatric   History: Denies family psych history  Social History:  Social History   Substance and Sexual Activity  Alcohol Use No     Social History   Substance and Sexual Activity  Drug Use No    Social History   Socioeconomic History   Marital status: Single    Spouse name: Not on file   Number of children: Not on file   Years of education: Not on file   Highest education level: Not on file  Occupational History   Not on file  Tobacco Use   Smoking status: Never   Smokeless tobacco: Never  Vaping Use   Vaping Use: Never used  Substance and Sexual Activity   Alcohol use: No   Drug use: No   Sexual activity: Yes    Partners: Female    Birth control/protection: Implant    Comment: Desires Nexplanon  Other Topics Concern   Not on file  Social History Narrative   Not on file   Social Determinants of Health   Financial Resource Strain: Not on file  Food Insecurity: No Food Insecurity (06/23/2022)   Hunger Vital Sign    Worried About Running Out of Food in the Last Year: Never true    Ran Out of Food in the Last Year: Never true  Transportation Needs: No Transportation Needs (06/23/2022)   PRAPARE - Hydrologist (Medical): No    Lack of Transportation (Non-Medical): No  Physical Activity: Not on file  Stress: Not on file  Social Connections: Not on file    Hospital Course:   During the patient's hospitalization, patient had extensive initial psychiatric evaluation, and follow-up  psychiatric evaluations every day.   Psychiatric diagnoses provided upon initial assessment:  Major depressive disorder-recurrent episode, severe without psychotic features Generalized anxiety disorder   Patient's psychiatric medications were adjusted on admission:  -Start Zoloft 25 mg for depressive symptoms and anxiety  -Hydroxyzine 25 mg tid PRN for anxiety   During the hospitalization, other adjustments were made to the patient's psychiatric medication regimen:   -increased zoloft to 100 mg for refractory depression -Start trazodone 50 mg titrated up to 100 mg for insomnia -Started pantoprazole 20 mg for GERD symptoms   Gradually, patient started adjusting to milieu.   Patient's care was discussed during the interdisciplinary team meeting every day during the hospitalization.   The patient denies having side effects to prescribed psychiatric medication.   The patient reports their target psychiatric symptoms of depress responded well to the psychiatric medications, and the patient reports overall benefit other psychiatric hospitalization. Supportive psychotherapy was provided to the patient. The patient also participated in regular group therapy while admitted.    Labs were reviewed with the patient, and abnormal results were discussed with the patient.   The patient denied having suicidal thoughts more than 48 hours prior to discharge.  Patient denies having homicidal thoughts.  Patient denies having auditory hallucinations.  Patient denies any visual hallucinations.  Patient denies having paranoid thoughts.   The patient is able to verbalize their individual safety plan to this provider.   It is recommended to the patient to continue psychiatric medications as prescribed, after discharge from the hospital.     It is recommended to the patient to follow up with your outpatient psychiatric provider and PCP.   Discussed with the patient, the impact of alcohol, drugs, tobacco have been there overall psychiatric and medical wellbeing, and total abstinence from substance use was recommended the patient.  Physical Findings:  Musculoskeletal: Strength & Muscle Tone: within normal limits Gait & Station: normal Patient leans: N/A   Psychiatric Specialty Exam:  Presentation  General Appearance:  Appropriate for Environment; Casual   Eye Contact: Good   Speech: Clear and Coherent; Normal Rate   Speech  Volume: Normal   Handedness: Right    Mood and Affect  Mood: Euthymic   Affect: Appropriate; Congruent    Thought Process  Thought Processes: Coherent; Goal Directed; Linear   Descriptions of Associations:Intact   Orientation:Full (Time, Place and Person)   Thought Content:Logical   History of Schizophrenia/Schizoaffective disorder:No   Duration of Psychotic Symptoms:No data recorded  Hallucinations:Hallucinations: None   Ideas of Reference:None   Suicidal Thoughts:Suicidal Thoughts: No   Homicidal Thoughts:Homicidal Thoughts: No    Sensorium  Memory: Immediate Good; Recent Good; Remote Good   Judgment: Fair   Insight: Fair    Community education officer  Concentration: Good   Attention Span: Good   Recall: Good   Fund of Knowledge: Good   Language: Good    Psychomotor Activity  Psychomotor Activity:Psychomotor Activity: Normal    Assets  Assets: Communication Skills; Desire for Improvement; Physical Health    Sleep  Sleep:Sleep: Good     Physical Exam: Physical Exam Vitals and nursing note reviewed.  Constitutional:      Appearance: Normal appearance. She is obese.  HENT:     Head: Normocephalic and atraumatic.  Pulmonary:     Effort: Pulmonary effort is normal.  Neurological:     General: No focal deficit present.     Mental Status: She is oriented to person, place, and time.    Review of  Systems  Respiratory:  Negative for shortness of breath.   Cardiovascular:  Negative for chest pain.  Gastrointestinal:  Negative for abdominal pain, constipation, diarrhea, heartburn, nausea and vomiting.  Neurological:  Negative for headaches.   Blood pressure 112/73, pulse 82, temperature 98.4 F (36.9 C), temperature source Oral, resp. rate 20, height 5\' 1"  (1.549 m), weight 89.4 kg, SpO2 100 %. Body mass index is 37.22 kg/m.   Social History   Tobacco Use  Smoking Status Never  Smokeless Tobacco Never    Tobacco Cessation:  N/A, patient does not currently use tobacco products   Blood Alcohol level:  Lab Results  Component Value Date   ETH <10 06/23/2022   ETH <10 01/20/2021    Metabolic Disorder Labs:  Lab Results  Component Value Date   HGBA1C 5.5 01/20/2021   MPG 111.15 01/20/2021   No results found for: "PROLACTIN" Lab Results  Component Value Date   CHOL 231 (H) 01/20/2021   TRIG 41 01/20/2021   HDL 38 (L) 01/20/2021   CHOLHDL 6.1 01/20/2021   VLDL 8 01/20/2021   LDLCALC 185 (H) 01/20/2021    See Psychiatric Specialty Exam and Suicide Risk Assessment completed by Attending Physician prior to discharge.  Discharge destination:  Home  Is patient on multiple antipsychotic therapies at discharge:  No   Has Patient had three or more failed trials of antipsychotic monotherapy by history:  No  Recommended Plan for Multiple Antipsychotic Therapies: NA  Discharge Instructions     Diet - low sodium heart healthy   Complete by: As directed    Discharge instructions   Complete by: As directed    Take all medications as prescribed by his/her mental healthcare provider. Report any adverse effects and or reactions from the medicines to your outpatient provider promptly. Do not engage in alcohol and or illegal drug use while on prescription medicines. In the event of worsening symptoms, call the crisis hotline, 911 and or go to the nearest ED for appropriate evaluation and treatment of symptoms. follow-up with your primary care provider for your other medical issues, concerns and or health care needs.   Increase activity slowly   Complete by: As directed       Allergies as of 06/28/2022       Reactions   Strawberry Extract Nausea And Vomiting   Projectile vomiting        Medication List     TAKE these medications      Indication  hydrOXYzine 25 MG tablet Commonly known as: ATARAX Take 1 tablet (25 mg total) by mouth every 8 (eight) hours as needed for  anxiety.  Indication: Feeling Anxious   melatonin 3 MG Tabs tablet Take 1 tablet (3 mg total) by mouth at bedtime.  Indication: Trouble Sleeping   pantoprazole 20 MG tablet Commonly known as: PROTONIX Take 1 tablet (20 mg total) by mouth daily.  Indication: Gastroesophageal Reflux Disease   sertraline 100 MG tablet Commonly known as: ZOLOFT Take 1 tablet (100 mg total) by mouth daily.  Indication: Generalized Anxiety Disorder, Major Depressive Disorder   traZODone 100 MG tablet Commonly known as: DESYREL Take 1 tablet (100 mg total) by mouth at bedtime.  Indication: Trouble Sleeping        Follow-up Information     [facility] Follow up on 06/29/2022.   Specialty: Behavioral Health Why: You are scheduled for an assessment for the PHP on 06/29/22 @ 10a. This appointment will last approximately one hour  and will be virtual via Webex. PHP is virtual group therapy that runs Mon-Fri from 9am-1pm. Please download the [employer] app prior to the appointment. If you need to cancel or reschedule, please call [phone] Contact information: [address] [place] [address] [phone]                 Follow-up recommendations:   Activity:  as tolerated Diet:  heart healthy   Comments:  Prescriptions were given at discharge.  Patient is agreeable with the discharge plan.  Patient was given an opportunity to ask questions.  Patient appears to feel comfortable with discharge and denies any current suicidal or homicidal thoughts.    Patient is instructed prior to discharge to: Take all medications as prescribed by mental healthcare provider. Report any adverse effects and or reactions from the medicines to outpatient provider promptly. In the event of worsening symptoms, patient is instructed to call the crisis hotline, 911 and or go to the nearest ED for appropriate evaluation and treatment of symptoms. Patient is to follow-up with  primary care provider for other medical issues, concerns and or health care needs.   Signed: Park Pope, MD 06/28/2022, 1:18 PM

## 2022-06-28 NOTE — Progress Notes (Signed)
D: Pt denied SI/HI/AVH this morning. Pt rated her depression a 0/10, anxiety a 0/10, and feelings of hopelessness a 0/10. Pt has been pleasant, calm, and cooperative throughout the shift.   A: RN provided support and encouragement to patient. Pt given scheduled medications as prescribed. Q15 min checks verified for safety.    R: Patient verbally contracts for safety. Patient compliant with medications and treatment plan. Patient is interacting well on the unit. Pt is safe on the unit.   06/28/22 0830  Psych Admission Type (Psych Patients Only)  Admission Status Voluntary  Psychosocial Assessment  Patient Complaints None  Eye Contact Fair  Facial Expression Flat  Affect Depressed  Speech Logical/coherent  Interaction Assertive  Motor Activity Slow  Appearance/Hygiene Unremarkable  Behavior Characteristics Appropriate to situation  Mood Sad  Thought Process  Coherency WDL  Content WDL  Delusions None reported or observed  Perception WDL  Hallucination None reported or observed  Judgment Impaired  Confusion None  Danger to Self  Current suicidal ideation? Denies  Description of Suicide Plan No plan  Agreement Not to Harm Self Yes  Description of Agreement Pt verbally contracts for safety  Danger to Others  Danger to Others None reported or observed

## 2022-06-28 NOTE — Group Note (Signed)
Recreation Therapy Group Note   Group Topic:Other  Group Date: 06/28/2022 Start Time: 1400 End Time: 1440 Facilitators: Jheri Mitter-McCall, LRT,CTRS Location: [address] Dayroom   Goal Area(s) Addresses:  Patient will engage in pro-social way in music group.  Patient will follow directions of drum leader on the first prompt. Patient will demonstrate no behavioral issues during group.  Patient will identify if a reduction in stress level occurs as a result of participation in therapeutic drum circle.    Activity Description/Intervention: Therapeutic Drumming. Patients with peers and staff were given the opportunity to engage in a leader facilitated [place] with staff from the [employer], in partnership with [employer]. Nurse, adult and trained [employer], Devin Going leading with LRT observing and documenting intervention and pt response. This evidenced-based practice targets 7 areas of health and wellbeing in the human experience including: stress-reduction, exercise, self-expression, camaraderie/support, nurturing, spirituality, and music-making (leisure).    Affect/Mood: Appropriate   Participation Level: Engaged   Participation Quality: Independent   Behavior: Appropriate   Speech/Thought Process: Focused    Clinical Observations/Individualized Feedback: Patient actively engaged in therapeutic drumming exercise and discussions. Pt was appropriate with peers, staff, and musical equipment for duration of programming.      Plan: Continue to engage patient in RT group sessions 2-3x/week.   Alieu Finnigan-McCall, LRT,CTRS 06/28/2022 3:36 PM

## 2022-06-28 NOTE — Plan of Care (Signed)
  Problem: Education: Goal: Ability to make informed decisions regarding treatment will improve Outcome: Completed/Met   Problem: Coping: Goal: Coping ability will improve Outcome: Completed/Met   Problem: Health Behavior/Discharge Planning: Goal: Identification of resources available to assist in meeting health care needs will improve Outcome: Completed/Met   Problem: Education: Goal: Mental status will improve Outcome: Completed/Met   Problem: Coping: Goal: Ability to demonstrate self-control will improve Outcome: Completed/Met

## 2022-06-28 NOTE — Progress Notes (Addendum)
Patient discharged from [facility] on 06/28/22 at 1600. Patient denies SI, plan, and intention. Suicide safety plan completed, reviewed with this RN, given to the patient, and a copy in the chart. Patient denies HI/AVH upon discharge. Patient rates her depression a 0/10 and her anxiety a 0/10. Patient is alert, oriented, and cooperative. RN provided patient with discharge paperwork and reviewed information with patient. Patient expressed that she understood all of the discharge instructions. Pt was satisfied with belongings returned to her from the locker and at bedside. Discharged patient to [facility] waiting room. Pt's mother and child awaiting patient in the [facility] waiting room.

## 2022-06-29 ENCOUNTER — Telehealth (HOSPITAL_COMMUNITY): Payer: Self-pay | Admitting: Professional

## 2022-06-29 ENCOUNTER — Ambulatory Visit (HOSPITAL_COMMUNITY): Payer: Medicaid Other | Admitting: Professional

## 2022-06-29 DIAGNOSIS — F411 Generalized anxiety disorder: Secondary | ICD-10-CM

## 2022-06-29 DIAGNOSIS — F332 Major depressive disorder, recurrent severe without psychotic features: Secondary | ICD-10-CM

## 2022-06-29 NOTE — Progress Notes (Signed)
  [facility] Adult Case Management Discharge Plan :  Will you be returning to the same living situation after discharge:  Yes,  Mother At discharge, do you have transportation home?: Yes,  Mother Do you have the ability to pay for your medications: Yes,  Insured  Release of information consent forms completed and in the chart;  Patient's signature needed at discharge.  Patient to Follow up at:  [facility] Follow up on 06/29/2022.   Specialty: Behavioral Health Why: You are scheduled for an assessment for the PHP on 06/29/22 @ 10a. This appointment will last approximately one hour and will be virtual via Webex. PHP is virtual group therapy that runs Mon-Fri from 9am-1pm. Please download the [employer] app prior to the appointment. If you need to cancel or reschedule, please call [phone] Contact information: [place] [phone]                Next level of care provider has access to [place] and Suicide Prevention discussed: Yes,  Mother     Has patient been referred to the Quitline?: N/A patient is not a smoker  Patient has been referred for addiction treatment: Yes Patient to continue working towards treatment goals after discharge. Patient no longer meets criteria for inpatient criteria per attending physician. Continue taking medications as prescribed, nursing to provide instructions at discharge. Follow up with all scheduled appointments.   [place], LCSW 06/29/2022, 8:03 AM

## 2022-07-03 ENCOUNTER — Telehealth (HOSPITAL_COMMUNITY): Payer: Self-pay | Admitting: Professional

## 2022-07-03 ENCOUNTER — Ambulatory Visit (HOSPITAL_COMMUNITY): Payer: Medicaid Other

## 2022-07-04 ENCOUNTER — Ambulatory Visit (INDEPENDENT_AMBULATORY_CARE_PROVIDER_SITE_OTHER): Payer: Medicaid Other | Admitting: Licensed Clinical Social Worker

## 2022-07-04 ENCOUNTER — Encounter (HOSPITAL_COMMUNITY): Payer: Self-pay

## 2022-07-04 DIAGNOSIS — F332 Major depressive disorder, recurrent severe without psychotic features: Secondary | ICD-10-CM

## 2022-07-04 DIAGNOSIS — F411 Generalized anxiety disorder: Secondary | ICD-10-CM
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
## 2022-07-05 ENCOUNTER — Ambulatory Visit (INDEPENDENT_AMBULATORY_CARE_PROVIDER_SITE_OTHER): Payer: Medicaid Other | Admitting: Licensed Clinical Social Worker

## 2022-07-05 DIAGNOSIS — F332 Major depressive disorder, recurrent severe without psychotic features: Secondary | ICD-10-CM

## 2022-07-05 NOTE — Psych (Signed)
Virtual Visit via Video Note  I connected with Heidi [name] on 07/05/22 at  9:00 AM EST by a video enabled telemedicine application and verified that I am speaking with the correct person using two identifiers.  Location: Patient: patient home Provider: clinical home office   I discussed the limitations of evaluation and management by telemedicine and the availability of in person appointments. The patient expressed understanding and agreed to proceed.  I discussed the assessment and treatment plan with the patient. The patient was provided an opportunity to ask questions and all were answered. The patient agreed with the plan and demonstrated an understanding of the instructions.   The patient was advised to call back or seek an in-person evaluation if the symptoms worsen or if the condition fails to improve as anticipated.  Pt was provided 240 minutes of non-face-to-face time during this encounter.   Heidi Glass, LCSW   [facility] [place] PHP THERAPIST PROGRESS NOTE  Heidi [name] [id number]  Session Time: 9:00 - 10:00  Participation Level: Active  Behavioral Response: CasualAlertDepressed  Type of Therapy: Group Therapy  Treatment Goals addressed: Coping  Progress Towards Goals: Initial  Interventions: CBT, DBT, Supportive, and Reframing  Summary: Heidi [name] is a 21 y.o. female who presents with depression symptoms.  Clinician led check-in regarding current stressors and situation, and review of patient completed daily inventory. Clinician utilized active listening and empathetic response and validated patient emotions. Clinician facilitated processing group on pertinent issues.?    Therapist Response: Patient arrived within time allowed. Patient rates her mood at a 7.5 on a scale of 1-10 with 10 being best. Pt states she feels "excited." Pt states she slept 8 hours and ate 3x. Pt reports a friend reached out to her and they will hang out this afternoon which pt  is looking forward to it. Pt reports spending yesterday with her son. Pt reports struggling with future oriented thinking. Patient able to process. Patient engaged in discussion.           Session Time: 10:00 am - 11:00 am   Participation Level: Active   Behavioral Response: CasualAlertDepressed   Type of Therapy: Group Therapy   Treatment Goals addressed: Coping   Progress Towards Goals: Progressing   Interventions: CBT, DBT, Solution Focused, Strength-based, Supportive, and Reframing   Therapist Response: [name] led discussion on uknowns and the way they impact our anxiety. [name] discussed cognitive restructuring, DBT distraction skills, and positive mantras/prayer as ways to address the anxiety. [name] discussed idea "I can do hard things" and ways to bolster confidence in our ability to handle difficult situations.    Therapist Response: Pt shared current unknowns they are dealing with and is able to identify ways to manage.          Session Time: 11:00 -12:00   Participation Level: Active   Behavioral Response: CasualAlertDepressed   Type of Therapy: Group Therapy, Spiritual Care   Treatment Goals addressed: Coping   Progress Towards Goals: Progressing   Interventions: Supportive, Education   Summary:  Heidi [name], Chaplain, led group.   Therapist Response: Pt participated           Session Time: 12:00 -1:00   Participation Level: Active   Behavioral Response: CasualAlertDepressed   Type of Therapy: Group therapy, Occupational Therapy   Treatment Goals addressed: Coping   Progress Towards Goals: Progressing   Interventions: Supportive; Psychoeducation   Summary: 12:00 - 12:50: Occupational Therapy group led by cln [name]. 12:50 - 1:00  Clinician assessed for immediate needs, medication compliance and efficacy, and safety concerns.   Therapist Response: 12:00 - 12:50: Pt participated 12:50 - 1:00 pm: At check-out, patient reports no immediate concerns.  Patient demonstrates progress as evidenced by continued engagement in group and responsiveness to treatment. Patient denies SI/HI/self-harm thoughts at the end of group.    Suicidal/Homicidal: Nowithout intent/plan   Plan: Pt will continue in PHP while working to decrease depression and anxiety symptoms, increase ADLS, and increase ability to manage symptoms in a healthy manner.    Collaboration of Care: Medication Management AEB [name]  Patient/Guardian was advised Release of Information must be obtained prior to any record release in order to collaborate their care with an outside provider. Patient/Guardian was advised if they have not already done so to contact the registration department to sign all necessary forms in order for [place] to release information regarding their care.   Consent: Patient/Guardian gives verbal consent for treatment and assignment of benefits for services provided during this visit. Patient/Guardian expressed understanding and agreed to proceed.   Diagnosis: MDD (major depressive disorder), recurrent severe, without psychosis ([place]) [F33.2]    1. MDD (major depressive disorder), recurrent severe, without psychosis ([place])   2. Generalized anxiety disorder       Heidi Glass, LCSW 07/05/2022

## 2022-07-06 ENCOUNTER — Ambulatory Visit (INDEPENDENT_AMBULATORY_CARE_PROVIDER_SITE_OTHER): Payer: Medicaid Other | Admitting: Licensed Clinical Social Worker

## 2022-07-06 DIAGNOSIS — F332 Major depressive disorder, recurrent severe without psychotic features: Secondary | ICD-10-CM | POA: Diagnosis not present

## 2022-07-06 DIAGNOSIS — F411 Generalized anxiety disorder: Secondary | ICD-10-CM

## 2022-07-06 NOTE — Psych (Signed)
Virtual Visit via Video Note  I connected with Heidi [name] on 07/06/22 at  9:00 AM EST by a video enabled telemedicine application and verified that I am speaking with the correct person using two identifiers.  Location: Patient: patient home Provider: clinical home office   I discussed the limitations of evaluation and management by telemedicine and the availability of in person appointments. The patient expressed understanding and agreed to proceed.  I discussed the assessment and treatment plan with the patient. The patient was provided an opportunity to ask questions and all were answered. The patient agreed with the plan and demonstrated an understanding of the instructions.   The patient was advised to call back or seek an in-person evaluation if the symptoms worsen or if the condition fails to improve as anticipated.  Pt was provided 240 minutes of non-face-to-face time during this encounter.   Lorin Glass, LCSW   [facility] [place] PHP THERAPIST PROGRESS NOTE  ERMIE GLENDENNING [id number]  Session Time: 9:00 - 10:00  Participation Level: Active  Behavioral Response: CasualAlertDepressed  Type of Therapy: Group Therapy  Treatment Goals addressed: Coping  Progress Towards Goals: Initial  Interventions: CBT, DBT, Supportive, and Reframing  Summary: Heidi [name] is a 22 y.o. female who presents with depression symptoms.  Clinician led check-in regarding current stressors and situation, and review of patient completed daily inventory. Clinician utilized active listening and empathetic response and validated patient emotions. Clinician facilitated processing group on pertinent issues.?    Therapist Response: Patient arrived within time allowed. Patient rates her mood at a 7.5 on a scale of 1-10 with 10 being best. Pt states she feels "pretty good." Pt states she slept 8.5 hours and ate 3x. Pt reports she had a positive time with her friend and "got stuff off my chest."  Pt reports it was the first time she's driven and been in public since her discharge and she had high anxiety. Patient able to process. Patient engaged in discussion.           Session Time: 10:00 am - 11:00 am   Participation Level: Active   Behavioral Response: CasualAlertDepressed   Type of Therapy: Group Therapy   Treatment Goals addressed: Coping   Progress Towards Goals: Progressing   Interventions: CBT, DBT, Solution Focused, Strength-based, Supportive, and Reframing   Therapist Response: [name] led discussion on the connection between fear and anxiety. [name] contextualized anxiety and fear as both being future oriented and fed by the unknown. Group members shared ways in which fear interacts with their anxiety and the problems it creates. [name] encouraged CBT thought challenging and reframing as ways to address problematic fears and anxieties.    Therapist Response:  Pt engaged in discussion and is able to make connections between fear and anxiety.         Session Time: 11:00 -12:00   Participation Level: Active   Behavioral Response: CasualAlertDepressed   Type of Therapy: Group Therapy   Treatment Goals addressed: Coping   Progress Towards Goals: Progressing   Interventions: CBT, DBT, Solution Focused, Strength-based, Supportive, and Reframing   Summary: [name] led discussion on decision making and how to apply logic to fears. Group viewed TED talk "Why you should define your fears not your goals" to aid discussion. Group discussed ways to consider how we can address fears and make them manageable.    Therapist Response:  Pt engaged in discussion and practices decision making model with group.  Session Time: 12:00 -1:00   Participation Level: Active   Behavioral Response: CasualAlertDepressed   Type of Therapy: Group therapy, Occupational Therapy   Treatment Goals addressed: Coping   Progress Towards Goals: Progressing   Interventions: Supportive;  Psychoeducation   Summary: 12:00 - 12:50: Occupational Therapy group led by cln [name]. 12:50 - 1:00 Clinician assessed for immediate needs, medication compliance and efficacy, and safety concerns.   Therapist Response: 12:00 - 12:50: Pt participated 12:50 - 1:00 pm: At check-out, patient reports no immediate concerns. Patient demonstrates progress as evidenced by continued engagement in group and responsiveness to treatment. Patient denies SI/HI/self-harm thoughts at the end of group.    Suicidal/Homicidal: Nowithout intent/plan   Plan: Pt will continue in PHP while working to decrease depression and anxiety symptoms, increase ADLS, and increase ability to manage symptoms in a healthy manner.    Collaboration of Care: Medication Management AEB [name]  Patient/Guardian was advised Release of Information must be obtained prior to any record release in order to collaborate their care with an outside provider. Patient/Guardian was advised if they have not already done so to contact the registration department to sign all necessary forms in order for [place] to release information regarding their care.   Consent: Patient/Guardian gives verbal consent for treatment and assignment of benefits for services provided during this visit. Patient/Guardian expressed understanding and agreed to proceed.   Diagnosis: MDD (major depressive disorder), recurrent severe, without psychosis ([place]) [F33.2]    1. MDD (major depressive disorder), recurrent severe, without psychosis ([place])   2. Generalized anxiety disorder       Lorin Glass, LCSW 07/06/2022

## 2022-07-07 ENCOUNTER — Encounter (HOSPITAL_COMMUNITY): Payer: Self-pay

## 2022-07-07 ENCOUNTER — Ambulatory Visit (INDEPENDENT_AMBULATORY_CARE_PROVIDER_SITE_OTHER): Payer: Medicaid Other | Admitting: Licensed Clinical Social Worker

## 2022-07-07 DIAGNOSIS — F332 Major depressive disorder, recurrent severe without psychotic features: Secondary | ICD-10-CM | POA: Diagnosis not present

## 2022-07-07 DIAGNOSIS — F411 Generalized anxiety disorder: Secondary | ICD-10-CM

## 2022-07-07 NOTE — Psych (Signed)
Virtual Visit via Video Note  I connected with Heidi [name] on 07/07/22 at  9:00 AM EST by a video enabled telemedicine application and verified that I am speaking with the correct person using two identifiers.  Location: Patient: patient home Provider: clinical home office   I discussed the limitations of evaluation and management by telemedicine and the availability of in person appointments. The patient expressed understanding and agreed to proceed.  I discussed the assessment and treatment plan with the patient. The patient was provided an opportunity to ask questions and all were answered. The patient agreed with the plan and demonstrated an understanding of the instructions.   The patient was advised to call back or seek an in-person evaluation if the symptoms worsen or if the condition fails to improve as anticipated.  Pt was provided 240 minutes of non-face-to-face time during this encounter.   Heidi Glass, LCSW   [facility] [place] PHP THERAPIST PROGRESS NOTE  MANJIT BUFANO [id number]  Session Time: 9:00 - 10:00  Participation Level: Active  Behavioral Response: CasualAlertDepressed  Type of Therapy: Group Therapy  Treatment Goals addressed: Coping  Progress Towards Goals: Progressing  Interventions: CBT, DBT, Supportive, and Reframing  Summary: Heidi [name] is a 22 y.o. female who presents with depression symptoms.  Clinician led check-in regarding current stressors and situation, and review of patient completed daily inventory. Clinician utilized active listening and empathetic response and validated patient emotions. Clinician facilitated processing group on pertinent issues.?    Therapist Response: Patient arrived within time allowed. Patient rates her mood at a 6.5 on a scale of 1-10 with 10 being best. Pt states she feels "tired." Pt states she slept 8 hours, broken, and ate 3x. Pt reports feeling "wiped" after group and taking a nap and resting in bed  most of the day. Pt states still feeling draggy and thinks the activity the day prior might have worn her out and made her mood lower.  Patient able to process. Patient engaged in discussion.           Session Time: 10:00 am - 11:00 am   Participation Level: Active   Behavioral Response: CasualAlertDepressed   Type of Therapy: Group Therapy   Treatment Goals addressed: Coping   Progress Towards Goals: Progressing   Interventions: CBT, DBT, Solution Focused, Strength-based, Supportive, and Reframing   Therapist Response: [name] led discussion on planning ahead as a way to mitigate anxiety. Group members shared current stressors and ways they could prepare by planning ahead to lower distress.   Therapist Response: Pt engaged in discussion and identified ways to plan ahead for current anxieties.          Session Time: 11:00 -12:00   Participation Level: Active   Behavioral Response: CasualAlertDepressed   Type of Therapy: Group Therapy   Treatment Goals addressed: Coping   Progress Towards Goals: Progressing   Interventions: CBT, DBT, Solution Focused, Strength-based, Supportive, and Reframing   Summary: [name] continued topic of DBT distress tolerance skills. [name] introduced Self-Soothe skills. Group discussed ways they can utilize the five senses to soothe themselves when struggling.    Therapist Response:  Pt engaged in discussion and identifies way to utilize the skill.            Session Time: 12:00 -1:00   Participation Level: Active   Behavioral Response: CasualAlertDepressed   Type of Therapy: Group therapy, Occupational Therapy   Treatment Goals addressed: Coping   Progress Towards Goals: Progressing   Interventions: Supportive;  Psychoeducation   Summary: 12:00 - 12:50: Occupational Therapy group led by cln [name]. 12:50 - 1:00 Clinician assessed for immediate needs, medication compliance and efficacy, and safety concerns.   Therapist Response: 12:00  - 12:50: Pt participated 12:50 - 1:00 pm: At check-out, patient reports no immediate concerns. Patient demonstrates progress as evidenced by continued engagement in group and responsiveness to treatment. Patient denies SI/HI/self-harm thoughts at the end of group.    Suicidal/Homicidal: Nowithout intent/plan   Plan: Pt will continue in PHP while working to decrease depression and anxiety symptoms, increase ADLS, and increase ability to manage symptoms in a healthy manner.    Collaboration of Care: Medication Management AEB [name]  Patient/Guardian was advised Release of Information must be obtained prior to any record release in order to collaborate their care with an outside provider. Patient/Guardian was advised if they have not already done so to contact the registration department to sign all necessary forms in order for [place] to release information regarding their care.   Consent: Patient/Guardian gives verbal consent for treatment and assignment of benefits for services provided during this visit. Patient/Guardian expressed understanding and agreed to proceed.   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 MDD (major depressive disorder), recurrent severe, without psychosis ([place])   2. Generalized anxiety disorder       Heidi Glass, LCSW 07/07/2022
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
## 2022-07-10 ENCOUNTER — Ambulatory Visit (INDEPENDENT_AMBULATORY_CARE_PROVIDER_SITE_OTHER): Payer: Medicaid Other | Admitting: Licensed Clinical Social Worker

## 2022-07-10 DIAGNOSIS — F411 Generalized anxiety disorder: Secondary | ICD-10-CM

## 2022-07-10 DIAGNOSIS — F332 Major depressive disorder, recurrent severe without psychotic features: Secondary | ICD-10-CM

## 2022-07-11 ENCOUNTER — Ambulatory Visit (HOSPITAL_COMMUNITY): Payer: Medicaid Other

## 2022-07-11 NOTE — Progress Notes (Incomplete)
BH MD/PA/NP OP Progress Note   Virtual Visit via Video Note  I connected with Heidi [name] on 07/11/22 at  9:00 AM EST by a video enabled telemedicine application and verified that I am speaking with the correct person using two identifiers.  Location: Patient: *** Provider: [facility]   I discussed the limitations of evaluation and management by telemedicine and the availability of in person appointments. The patient expressed understanding and agreed to proceed.   07/11/2022 9:00 AM Heidi [name]  MRN:  [id number]  Chief Complaint: No chief complaint on file.  HPI:  Heidi [name] is a 22 yr old female who presents via Virtual Video Visit for Follow Up and Medication Management, she enrolled in the [facility] Program 07/04/2022.  PPHx is significant for MDD, Recurrent, Severe, w/out Psychosis and GAD, and Multiple Suicide Attempts (all cutting wrist, last 05/2022) and 1 Prior Psychiatric Hospitalization [facility] 05/2022).    ***   Visit Diagnosis: No diagnosis found.  Past Psychiatric History: MDD, Recurrent, Severe, w/out Psychosis and GAD, and 3 Suicide Attempts (all cutting wrist, last 05/2022) and 1 Prior Psychiatric Hospitalization [facility] 05/2022).     Past Medical History:  Past Medical History:  Diagnosis Date   Depression    Medical history non-contributory     Past Surgical History:  Procedure Laterality Date   CESAREAN SECTION N/A 10/21/2019   Procedure: CESAREAN SECTION;  Surgeon: Truett Mainland, DO;  Location: [place] LD ORS;  Service: Obstetrics;  Laterality: N/A;   NO PAST SURGERIES      Family Psychiatric History: Reports No Known Diagnosis', Substance Abuse, or Suicides.   Family History:  Family History  Problem Relation Age of Onset   Diabetes Mother    Hypertension Mother     Social History:  Social History   Socioeconomic History   Marital status: Single    Spouse name: Not on file   Number of children: 1   Years of education: Not on file    Highest education level: Some college, no degree  Occupational History   Not on file  Tobacco Use   Smoking status: Never   Smokeless tobacco: Never  Vaping Use   Vaping Use: Never used  Substance and Sexual Activity   Alcohol use: No   Drug use: No   Sexual activity: Yes    Partners: Female    Birth control/protection: Implant    Comment: Desires Nexplanon  Other Topics Concern   Not on file  Social History Narrative   Not on file   Social Determinants of Health   Financial Resource Strain: Not on file  Food Insecurity: No Food Insecurity (06/23/2022)   Hunger Vital Sign    Worried About Running Out of Food in the Last Year: Never true    Ran Out of Food in the Last Year: Never true  Transportation Needs: No Transportation Needs (06/23/2022)   PRAPARE - Hydrologist (Medical): No    Lack of Transportation (Non-Medical): No  Physical Activity: Not on file  Stress: Not on file  Social Connections: Not on file    Allergies:  Allergies  Allergen Reactions   Strawberry Extract Nausea And Vomiting    Projectile vomiting    Metabolic Disorder Labs: Lab Results  Component Value Date   HGBA1C 5.5 01/20/2021   MPG 111.15 01/20/2021   No results found for: "PROLACTIN" Lab Results  Component Value Date   CHOL 231 (H) 01/20/2021   TRIG 41  01/20/2021   HDL 38 (L) 01/20/2021   CHOLHDL 6.1 01/20/2021   VLDL 8 01/20/2021   LDLCALC 185 (H) 01/20/2021   Lab Results  Component Value Date   TSH 1.738 01/20/2021    Therapeutic Level Labs: No results found for: "LITHIUM" No results found for: "VALPROATE" No results found for: "CBMZ"  Current Medications: Current Outpatient Medications  Medication Sig Dispense Refill   hydrOXYzine (ATARAX) 25 MG tablet Take 1 tablet (25 mg total) by mouth every 8 (eight) hours as needed for anxiety. 60 tablet 0   melatonin 3 MG TABS tablet Take 1 tablet (3 mg total) by mouth at bedtime.  0   pantoprazole  (PROTONIX) 20 MG tablet Take 1 tablet (20 mg total) by mouth daily. 30 tablet 0   sertraline (ZOLOFT) 100 MG tablet Take 1 tablet (100 mg total) by mouth daily. 30 tablet 0   traZODone (DESYREL) 100 MG tablet Take 1 tablet (100 mg total) by mouth at bedtime. 30 tablet 0   No current facility-administered medications for this visit.     Musculoskeletal: Strength & Muscle Tone: {desc; muscle tone:32375} Gait & Station: {PE GAIT ED IDPO:24235} Patient leans: {Patient Leans:21022755}  Psychiatric Specialty Exam: Review of Systems  There were no vitals taken for this visit.There is no height or weight on file to calculate BMI.  General Appearance: {Appearance:22683}  Eye Contact:  {BHH EYE CONTACT:22684}  Speech:  {Speech:22685}  Volume:  {Volume (PAA):22686}  Mood:  {BHH MOOD:22306}  Affect:  {Affect (PAA):22687}  Thought Process:  {Thought Process (PAA):22688}  Orientation:  {BHH ORIENTATION (PAA):22689}  Thought Content: {Thought Content:22690}   Suicidal Thoughts:  {ST/HT (PAA):22692}  Homicidal Thoughts:  {ST/HT (PAA):22692}  Memory:  {BHH MEMORY:22881}  Judgement:  {Judgement (PAA):22694}  Insight:  {Insight (PAA):22695}  Psychomotor Activity:  {Psychomotor (PAA):22696}  Concentration:  {Concentration:21399}  Recall:  {BHH GOOD/FAIR/POOR:22877}  Fund of Knowledge: {BHH GOOD/FAIR/POOR:22877}  Language: {BHH GOOD/FAIR/POOR:22877}  Akathisia:  {BHH YES OR NO:22294}  Handed:  {Handed:22697}  AIMS (if indicated): {Desc; done/not:10129}  Assets:  {Assets (PAA):22698}  ADL's:  {BHH TIR'W:43154}  Cognition: {chl bhh cognition:304700322}  Sleep:  {BHH GOOD/FAIR/POOR:22877}   Screenings: AUDIT    Flowsheet Row Admission (Discharged) from 06/23/2022 in [place] [facility]  Alcohol Use Disorder Identification Test Final Score (AUDIT) 0      GAD-7    Flowsheet Row Routine Prenatal from 10/08/2019 in Center for [facility] Routine  Prenatal from 10/02/2019 in Center for [facility] Video Visit from 09/11/2019 in [place] for [facility] Initial Prenatal from 07/01/2019 in [place] for [place] from 06/30/2019 in Center for [facility]  Total GAD-7 Score 6 4 4 5 3       PHQ2-9    Flowsheet Row Counselor from 07/07/2022 in [facility] Counselor from 06/29/2022 in [facility] ED from [facility]/20/2021 in [facility] Routine Prenatal from 10/08/2019 in [place] for [facility] Routine Prenatal from 10/02/2019 in Center for [facility]  PHQ-2 Total Score 2 5 3  0 0  PHQ-9 Total Score 9 17 12 2 2       Flowsheet Row Counselor from 07/07/2022 in [facility] Counselor from 06/29/2022 in [facility] Admission (Discharged) from 06/23/2022 in [place] [facility]  C-SSRS RISK CATEGORY Error: Q3, 4, or 5 should not be populated when Q2 is No High Risk  High Risk        Assessment and Plan:  Heidi [name] is a 22 yr old female who presents via Virtual Video Visit for Follow Up and Medication Management, she enrolled in the [facility] 07/04/2022.  PPHx is significant for MDD, Recurrent, Severe, w/out Psychosis and GAD, and Multiple Suicide Attempts (all cutting wrist, last 05/2022) and 1 Prior Psychiatric Hospitalization [facility] 05/2022).    [place] ***   MDD, Recurrent, Severe, w/out Psychosis  GAD: -Continue Zoloft 100 mg daily for depression and anxiety.  No refills sent at this time. -Continue Hydroxyzine 25 mg TID PRN for anxiety.  No refills sent at this time.     Insomnia: -Continue Trazodone 100 mg QHS PRN.  No refills sent at this time. -Continue Melatonin.  OTC    Collaboration of Care: Collaboration of Care: Other PHP Program  Patient/Guardian  was advised Release of Information must be obtained prior to any record release in order to collaborate their care with an outside provider. Patient/Guardian was advised if they have not already done so to contact the registration department to sign all necessary forms in order for [place] to release information regarding their care.   Consent: Patient/Guardian gives verbal consent for treatment and assignment of benefits for services provided during this visit. Patient/Guardian expressed understanding and agreed to proceed.    Lauro Franklin, MD 07/11/2022, 9:00 AM   Follow Up Instructions:    I discussed the assessment and treatment plan with the patient. The patient was provided an opportunity to ask questions and all were answered. The patient agreed with the plan and demonstrated an understanding of the instructions.   The patient was advised to call back or seek an in-person evaluation if the symptoms worsen or if the condition fails to improve as anticipated.  I provided *** minutes of non-face-to-face time during this encounter.   Lauro Franklin, MD

## 2022-07-12 ENCOUNTER — Ambulatory Visit (INDEPENDENT_AMBULATORY_CARE_PROVIDER_SITE_OTHER): Payer: Medicaid Other | Admitting: Licensed Clinical Social Worker

## 2022-07-12 DIAGNOSIS — F411 Generalized anxiety disorder: Secondary | ICD-10-CM

## 2022-07-12 DIAGNOSIS — F332 Major depressive disorder, recurrent severe without psychotic features: Secondary | ICD-10-CM | POA: Diagnosis not present

## 2022-07-12 NOTE — Psych (Signed)
Virtual Visit via Video Note  I connected with Heidi [name] on 07/10/22 at  9:00 AM EST by a video enabled telemedicine application and verified that I am speaking with the correct person using two identifiers.  Location: Patient: patient home Provider: clinical home office   I discussed the limitations of evaluation and management by telemedicine and the availability of in person appointments. The patient expressed understanding and agreed to proceed.  I discussed the assessment and treatment plan with the patient. The patient was provided an opportunity to ask questions and all were answered. The patient agreed with the plan and demonstrated an understanding of the instructions.   The patient was advised to call back or seek an in-person evaluation if the symptoms worsen or if the condition fails to improve as anticipated.  Pt was provided 240 minutes of non-face-to-face time during this encounter.   Lorin Glass, LCSW   [facility] [place] [facility] THERAPIST PROGRESS NOTE  YASEMIN RABON [id number]  Session Time: 9:00 - 10:00  Participation Level: Active  Behavioral Response: CasualAlertDepressed  Type of Therapy: Group Therapy  Treatment Goals addressed: Coping  Progress Towards Goals: Progressing  Interventions: CBT, DBT, Supportive, and Reframing  Summary: Heidi [name] is a 22 y.o. female who presents with depression symptoms.  Clinician led check-in regarding current stressors and situation, and review of patient completed daily inventory. Clinician utilized active listening and empathetic response and validated patient emotions. Clinician facilitated processing group on pertinent issues.?    Therapist Response: Patient arrived within time allowed. Patient rates her mood at a 4 on a scale of 1-10 with 10 being best. Pt states she feels "sick." Pt states she slept 4 hours, broken, and ate 2x. Pt reports she has a cold and sounds congested. Pt reports it negatively  impacted sleep and appetite all weekend. Pt shares struggles with dreams that she takes as a sign her ex is going to come back to mess things up. Patient able to process. Patient engaged in discussion.           Session Time: 10:00 am - 11:00 am   Participation Level: Active   Behavioral Response: CasualAlertDepressed   Type of Therapy: Group Therapy   Treatment Goals addressed: Coping   Progress Towards Goals: Progressing   Interventions: CBT, DBT, Solution Focused, Strength-based, Supportive, and Reframing   Therapist Response: [name] led discussion on "spiraling" or when our thoughts compound on one another to descend our feelings into worse and worse situations. Group members discussed the ways in which spiraling is difficult for them and when they are especially vulnerable. [name] offered DBT distraction skills as a way to halt the spiral once you recognize it.    Therapist Response: Pt engaged in discussion and is able to increase awareness of spiraling throughout the discussion.          Session Time: 11:00 -12:00   Participation Level: Active   Behavioral Response: CasualAlertDepressed   Type of Therapy: Group Therapy   Treatment Goals addressed: Coping   Progress Towards Goals: Progressing   Interventions: CBT, DBT, Solution Focused, Strength-based, Supportive, and Reframing   Summary: [name] led discussion on HALT (hungry, angry, lonely, tired) acronym, which encourages check-in with ourselves when feeling emotional. [name] encouraged pt's to use HALT as a first check-in step to address basic vulnerabilities before reacting. Group members discussed ways in which they react to being angry, lonely, and tired, and work to determine times in which utilizing HALT would have diverted a  bigger issue.    Therapist Response: Pt engaged in discussion and reports understanding of HALT and willingness to utilize it.           Session Time: 12:00 -1:00   Participation Level:  Active   Behavioral Response: CasualAlertDepressed   Type of Therapy: Group therapy, Occupational Therapy   Treatment Goals addressed: Coping   Progress Towards Goals: Progressing   Interventions: Supportive; Psychoeducation   Summary: 12:00 - 12:50: Occupational Therapy group led by cln [name]. 12:50 - 1:00 Clinician assessed for immediate needs, medication compliance and efficacy, and safety concerns.   Therapist Response: 12:00 - 12:50: Pt participated 12:50 - 1:00 pm: At check-out, patient reports no immediate concerns. Patient demonstrates progress as evidenced by continued engagement in group and responsiveness to treatment. Patient denies SI/HI/self-harm thoughts at the end of group.    Suicidal/Homicidal: Nowithout intent/plan   Plan: Pt will continue in PHP while working to decrease depression and anxiety symptoms, increase ADLS, and increase ability to manage symptoms in a healthy manner.    Collaboration of Care: Medication Management AEB [name]  Patient/Guardian was advised Release of Information must be obtained prior to any record release in order to collaborate their care with an outside provider. Patient/Guardian was advised if they have not already done so to contact the registration department to sign all necessary forms in order for [place] to release information regarding their care.   Consent: Patient/Guardian gives verbal consent for treatment and assignment of benefits for services provided during this visit. Patient/Guardian expressed understanding and agreed to proceed.   Diagnosis: MDD (major depressive disorder), recurrent severe, without psychosis ([place]) [F33.2]    1. MDD (major depressive disorder), recurrent severe, without psychosis ([place])   2. Generalized anxiety disorder       Lorin Glass, LCSW 07/12/2022

## 2022-07-13 ENCOUNTER — Encounter (HOSPITAL_COMMUNITY): Payer: Self-pay

## 2022-07-13 ENCOUNTER — Ambulatory Visit (INDEPENDENT_AMBULATORY_CARE_PROVIDER_SITE_OTHER): Payer: Medicaid Other | Admitting: Licensed Clinical Social Worker

## 2022-07-13 DIAGNOSIS — F411 Generalized anxiety disorder: Secondary | ICD-10-CM

## 2022-07-13 DIAGNOSIS — F332 Major depressive disorder, recurrent severe without psychotic features: Secondary | ICD-10-CM | POA: Diagnosis not present

## 2022-07-13 NOTE — Progress Notes (Signed)
BH MD/PA/NP PHP Progress Note  Virtual Visit via Video Note  I connected with Heidi [name] on 07/13/22 at  9:00 AM EST by a video enabled telemedicine application and verified that I am speaking with the correct person using two identifiers.  Location: Patient: Home Provider: [facility]   I discussed the limitations of evaluation and management by telemedicine and the availability of in person appointments. The patient expressed understanding and agreed to proceed.   07/13/2022 10:35 AM Heidi [name]  MRN:  [id number]  Chief Complaint:  Chief Complaint  Patient presents with   Follow-up   HPI:  Heidi [name] is a 22 yr old female who presents via Virtual Video Visit for Follow Up and Medication Management, she enrolled in the [facility] Program 07/04/2022.  PPHx is significant for MDD, Recurrent, Severe, w/out Psychosis and GAD, and 3 Suicide Attempts (all cutting wrist, last 05/2022) and 1 Prior Psychiatric Hospitalization [facility] 05/2022).   She reports that she is doing good today.  She reports that she has been getting benefit from the first week of groups.  She reports that she has been learning skills she has already been implementing in her life.  She reports her depression and anxiety are being well controlled.  She reports no side effects with her medications.  She reports no SI, HI, or AVH.  She reports her sleep is good.  She reports her appetite is doing good.  She reports no concerns present.  Visit Diagnosis:    ICD-10-CM   1. MDD (major depressive disorder), recurrent severe, without psychosis (HCC)  F33.2     2. Generalized anxiety disorder  F41.1       Past Psychiatric History: MDD, Recurrent, Severe, w/out Psychosis and GAD, and 3 Suicide Attempts (all cutting wrist, last 05/2022) and 1 Prior Psychiatric Hospitalization [facility] 05/2022).   Past Medical History:  Past Medical History:  Diagnosis Date   Depression    Medical history non-contributory     Past  Surgical History:  Procedure Laterality Date   CESAREAN SECTION N/A 10/21/2019   Procedure: CESAREAN SECTION;  Surgeon: Levie Heritage, DO;  Location: [facility] LD ORS;  Service: Obstetrics;  Laterality: N/A;   NO PAST SURGERIES      Family Psychiatric History: Reports No Known Diagnosis', Substance Abuse, or Suicides.   Family History:  Family History  Problem Relation Age of Onset   Diabetes Mother    Hypertension Mother     Social History:  Social History   Socioeconomic History   Marital status: Single    Spouse name: Not on file   Number of children: 1   Years of education: Not on file   Highest education level: Some college, no degree  Occupational History   Not on file  Tobacco Use   Smoking status: Never   Smokeless tobacco: Never  Vaping Use   Vaping Use: Never used  Substance and Sexual Activity   Alcohol use: No   Drug use: No   Sexual activity: Yes    Partners: Female    Birth control/protection: Implant    Comment: Desires Nexplanon  Other Topics Concern   Not on file  Social History Narrative   Not on file   Social Determinants of Health   Financial Resource Strain: Not on file  Food Insecurity: No Food Insecurity (06/23/2022)   Hunger Vital Sign    Worried About Running Out of Food in the Last Year: Never true    Ran Out  of Food in the Last Year: Never true  Transportation Needs: No Transportation Needs (06/23/2022)   PRAPARE - Hydrologist (Medical): No    Lack of Transportation (Non-Medical): No  Physical Activity: Not on file  Stress: Not on file  Social Connections: Not on file    Allergies:  Allergies  Allergen Reactions   Strawberry Extract Nausea And Vomiting    Projectile vomiting    Metabolic Disorder Labs: Lab Results  Component Value Date   HGBA1C 5.5 01/20/2021   MPG 111.15 01/20/2021   No results found for: "PROLACTIN" Lab Results  Component Value Date   CHOL 231 (H) 01/20/2021   TRIG 41  01/20/2021   HDL 38 (L) 01/20/2021   CHOLHDL 6.1 01/20/2021   VLDL 8 01/20/2021   LDLCALC 185 (H) 01/20/2021   Lab Results  Component Value Date   TSH 1.738 01/20/2021    Therapeutic Level Labs: No results found for: "LITHIUM" No results found for: "VALPROATE" No results found for: "CBMZ"  Current Medications: Current Outpatient Medications  Medication Sig Dispense Refill   hydrOXYzine (ATARAX) 25 MG tablet Take 1 tablet (25 mg total) by mouth every 8 (eight) hours as needed for anxiety. 60 tablet 0   melatonin 3 MG TABS tablet Take 1 tablet (3 mg total) by mouth at bedtime.  0   pantoprazole (PROTONIX) 20 MG tablet Take 1 tablet (20 mg total) by mouth daily. 30 tablet 0   sertraline (ZOLOFT) 100 MG tablet Take 1 tablet (100 mg total) by mouth daily. 30 tablet 0   traZODone (DESYREL) 100 MG tablet Take 1 tablet (100 mg total) by mouth at bedtime. 30 tablet 0   No current facility-administered medications for this visit.     Musculoskeletal: Strength & Muscle Tone: within normal limits Gait & Station:  sitting during interview Patient leans: N/A  Psychiatric Specialty Exam: Review of Systems  Respiratory:  Negative for shortness of breath.   Cardiovascular:  Negative for chest pain.  Gastrointestinal:  Negative for abdominal pain, constipation, diarrhea, nausea and vomiting.  Neurological:  Negative for dizziness, weakness and headaches.  Psychiatric/Behavioral:  Negative for dysphoric mood, hallucinations, sleep disturbance and suicidal ideas. The patient is not nervous/anxious.     There were no vitals taken for this visit.There is no height or weight on file to calculate BMI.  General Appearance: Casual and Fairly Groomed  Eye Contact:  Good  Speech:  Clear and Coherent and Normal Rate  Volume:  Normal  Mood:   "ok"  Affect:  Appropriate and Congruent  Thought Process:  Coherent and Goal Directed  Orientation:  Full (Time, Place, and Person)  Thought Content: WDL  and Logical   Suicidal Thoughts:  No  Homicidal Thoughts:  No  Memory:  Immediate;   Good Recent;   Good  Judgement:  Good  Insight:  Good  Psychomotor Activity:  Normal  Concentration:  Concentration: Good and Attention Span: Good  Recall:  Good  Fund of Knowledge: Good  Language: Good  Akathisia:  Negative  Handed:  Right  AIMS (if indicated): not done  Assets:  Communication Skills Desire for Improvement Housing Physical Health Resilience Social Support  ADL's:  Intact  Cognition: WNL  Sleep:  Good   Screenings: AUDIT    Flowsheet Row Admission (Discharged) from 06/23/2022 in [facility]  Alcohol Use Disorder Identification Test Final Score (AUDIT) 0      GAD-7    Flowsheet  Row Routine Prenatal from 10/08/2019 in [place] for [facility] Routine Prenatal from 10/02/2019 in [place] for [facility] Video Visit from 09/11/2019 in [place] for [facility] Initial Prenatal from 07/01/2019 in [place] for [place] from 06/30/2019 in [place] for [facility]  Total GAD-7 Score 6 4 4 5 3       PHQ2-9    Flowsheet Row Counselor from 07/07/2022 in [facility] Counselor from 06/29/2022 in [facility] ED from 01/20/2021 in [facility] Routine Prenatal from 10/08/2019 in [place] for [facility] Routine Prenatal from 10/02/2019 in [place] for [facility]  PHQ-2 Total Score 2 5 3  0 0  PHQ-9 Total Score 9 17 12 2 2       Flowsheet Row Counselor from 07/07/2022 in [facility] Counselor from 06/29/2022 in [facility] Admission (Discharged) from 06/23/2022 in [place] [facility]  C-SSRS RISK CATEGORY Error: Q3, 4, or 5 should not be populated when Q2 is No High Risk  High Risk        Assessment and Plan:  Heidi [name] is a 22 yr old female who presents via Virtual Video Visit for Follow Up and Medication Management, she enrolled in the [facility] Program 07/04/2022.  PPHx is significant for MDD, Recurrent, Severe, w/out Psychosis and GAD, and 3 Suicide Attempts (all cutting wrist, last 05/2022) and 1 Prior Psychiatric Hospitalization [facility] 05/2022).      Heidi [name] has been having great benefit from the program already.  She is not having any issues with her medications.  We will not make any changes to her medications at this time.  We will continue to monitor.      MDD, Recurrent, Severe, w/out Psychosis  GAD: -Continue Zoloft 100 mg daily for depression and anxiety.  No refills sent at this time. -Continue Hydroxyzine 25 mg TID PRN for anxiety.  No refills sent at this time.     Insomnia: -Continue Trazodone 100 mg QHS PRN.  No refills sent at this time. -Continue Melatonin.  OTC   Collaboration of Care: Collaboration of Care: Other PHP Program  Patient/Guardian was advised Release of Information must be obtained prior to any record release in order to collaborate their care with an outside provider. Patient/Guardian was advised if they have not already done so to contact the registration department to sign all necessary forms in order for [place] to release information regarding their care.   Consent: Patient/Guardian gives verbal consent for treatment and assignment of benefits for services provided during this visit. Patient/Guardian expressed understanding and agreed to proceed.    Briant Cedar, MD 07/13/2022, 10:35 AM   Follow Up Instructions:    I discussed the assessment and treatment plan with the patient. The patient was provided an opportunity to ask questions and all were answered. The patient agreed with the plan and demonstrated an understanding of the instructions.   The patient was advised to call back or seek an in-person  evaluation if the symptoms worsen or if the condition fails to improve as anticipated.  I provided 10 minutes of non-face-to-face time during this encounter.   Briant Cedar, MD

## 2022-07-14 ENCOUNTER — Ambulatory Visit (INDEPENDENT_AMBULATORY_CARE_PROVIDER_SITE_OTHER): Payer: Medicaid Other | Admitting: Licensed Clinical Social Worker

## 2022-07-14 DIAGNOSIS — F332 Major depressive disorder, recurrent severe without psychotic features: Secondary | ICD-10-CM | POA: Diagnosis not present

## 2022-07-14 DIAGNOSIS — F411 Generalized anxiety disorder: Secondary | ICD-10-CM

## 2022-07-14 NOTE — Psych (Signed)
Virtual Visit via Video Note  I connected with Heidi [name] on 07/12/22 at  9:00 AM EST by a video enabled telemedicine application and verified that I am speaking with the correct person using two identifiers.  Location: Patient: patient home Provider: clinical home office   I discussed the limitations of evaluation and management by telemedicine and the availability of in person appointments. The patient expressed understanding and agreed to proceed.  I discussed the assessment and treatment plan with the patient. The patient was provided an opportunity to ask questions and all were answered. The patient agreed with the plan and demonstrated an understanding of the instructions.   The patient was advised to call back or seek an in-person evaluation if the symptoms worsen or if the condition fails to improve as anticipated.  Pt was provided 240 minutes of non-face-to-face time during this encounter.   Heidi Glass, LCSW   [place] [facility] PHP THERAPIST PROGRESS NOTE  Heidi [name] [id number]  Session Time: 9:00 - 10:00  Participation Level: Active  Behavioral Response: CasualAlertDepressed  Type of Therapy: Group Therapy  Treatment Goals addressed: Coping  Progress Towards Goals: Progressing  Interventions: CBT, DBT, Supportive, and Reframing  Summary: Heidi [name] is a 22 y.o. female who presents with depression symptoms.  Clinician led check-in regarding current stressors and situation, and review of patient completed daily inventory. Clinician utilized active listening and empathetic response and validated patient emotions. Clinician facilitated processing group on pertinent issues.?    Therapist Response: Patient arrived within time allowed. Patient rates her mood at a 8 on a scale of 1-10 with 10 being best. Pt states she feels "better." Pt states she slept 8+ hours and ate 1x. Pt reports she felt "terrible" yesterday due to her cold and spent the day resting  and recovering, which helped. Patient able to process. Patient engaged in discussion.           Session Time: 10:00 am - 11:00 am   Participation Level: Active   Behavioral Response: CasualAlertDepressed   Type of Therapy: Group Therapy   Treatment Goals addressed: Coping   Progress Towards Goals: Progressing   Interventions:  Medication group, supportive, psychoeducation   Therapist Response: Pharmacist, Dickie La led medication group and discussed the role of psychological medications in mental health treatment, how they can effect the brain and body, common side effects, and concerns to watch out for. Group was given a chance to ask and have question answered regarding their own medication journeys.    Therapist Response: Pt engaged in discussion.          Session Time: 11:00 -12:00   Participation Level: Active   Behavioral Response: CasualAlertDepressed   Type of Therapy: Group Therapy   Treatment Goals addressed: Coping   Progress Towards Goals: Progressing   Interventions: Wellness, supportive, psychoeducation   Summary: Heidi [name] led wellness group to discuss how wellness topics of sleep, nutrition, and exercise can support our mental health and how to incorporate positive changes into our day.    Therapist Response: Pt engaged in discussion and is able to identify a positive change to add to their daily schedule.            Session Time: 12:00 -1:00   Participation Level: Active   Behavioral Response: CasualAlertDepressed   Type of Therapy: Group therapy, Occupational Therapy   Treatment Goals addressed: Coping   Progress Towards Goals: Progressing   Interventions: Supportive; Psychoeducation   Summary: 12:00 -  12:50: Occupational Therapy group led by cln [name]. 12:50 - 1:00 Clinician assessed for immediate needs, medication compliance and efficacy, and safety concerns.   Therapist Response: 12:00 - 12:50: Pt  participated 12:50 - 1:00 pm: At check-out, patient reports no immediate concerns. Patient demonstrates progress as evidenced by continued engagement in group and responsiveness to treatment. Patient denies SI/HI/self-harm thoughts at the end of group.    Suicidal/Homicidal: Nowithout intent/plan   Plan: Pt will continue in PHP while working to decrease depression and anxiety symptoms, increase ADLS, and increase ability to manage symptoms in a healthy manner.    Collaboration of Care: Medication Management AEB [name]  Patient/Guardian was advised Release of Information must be obtained prior to any record release in order to collaborate their care with an outside provider. Patient/Guardian was advised if they have not already done so to contact the registration department to sign all necessary forms in order for [place] to release information regarding their care.   Consent: Patient/Guardian gives verbal consent for treatment and assignment of benefits for services provided during this visit. Patient/Guardian expressed understanding and agreed to proceed.   Diagnosis: MDD (major depressive disorder), recurrent severe, without psychosis ([place]) [F33.2]    1. MDD (major depressive disorder), recurrent severe, without psychosis ([place])   2. Generalized anxiety disorder       Heidi Glass, LCSW 07/14/2022

## 2022-07-14 NOTE — Psych (Signed)
Virtual Visit via Video Note  I connected with Heidi [name] on 07/13/22 at  9:00 AM EST by a video enabled telemedicine application and verified that I am speaking with the correct person using two identifiers.  Location: Patient: patient home Provider: clinical home office   I discussed the limitations of evaluation and management by telemedicine and the availability of in person appointments. The patient expressed understanding and agreed to proceed.  I discussed the assessment and treatment plan with the patient. The patient was provided an opportunity to ask questions and all were answered. The patient agreed with the plan and demonstrated an understanding of the instructions.   The patient was advised to call back or seek an in-person evaluation if the symptoms worsen or if the condition fails to improve as anticipated.  Pt was provided 240 minutes of non-face-to-face time during this encounter.   Lorin Glass, LCSW   [facility] PHP THERAPIST PROGRESS NOTE  Heidi [name] [id number]  Session Time: 9:00 - 10:00  Participation Level: Active  Behavioral Response: CasualAlertDepressed  Type of Therapy: Group Therapy  Treatment Goals addressed: Coping  Progress Towards Goals: Progressing  Interventions: CBT, DBT, Supportive, and Reframing  Summary: Heidi QUIZHPI is a 22 y.o. female who presents with depression symptoms.  Clinician led check-in regarding current stressors and situation, and review of patient completed daily inventory. Clinician utilized active listening and empathetic response and validated patient emotions. Clinician facilitated processing group on pertinent issues.?    Therapist Response: Patient arrived within time allowed. Patient rates her mood at a 8 on a scale of 1-10 with 10 being best. Pt states she feels "pretty good." Pt states she slept 6 hours and ate 2x. Pt reports she continues to heal from her cold. Pt states her water was turned  of in error which disrupted her afternoon and her plans. Patient able to process. Patient engaged in discussion.           Session Time: 10:00 am - 11:00 am   Participation Level: Active   Behavioral Response: CasualAlertDepressed   Type of Therapy: Group Therapy   Treatment Goals addressed: Coping   Progress Towards Goals: Progressing   Interventions: CBT, DBT, Solution Focused, Strength-based, Supportive, and Reframing   Therapist Response: [name] led discussion on the 5 stages of grief and the way loss affects [place]. [name] encouraged pt to consider grief as a journey to accepting a new future. [name] created space for pt to process grief concerns and validated pt's experiences.     Therapist Response: Pt engaged in discussion and was able to identify areas of loss and process.          Session Time: 11:00 -12:00   Participation Level: Active   Behavioral Response: CasualAlertDepressed   Type of Therapy: Group Therapy   Treatment Goals addressed: Coping   Progress Towards Goals: Progressing   Interventions: CBT, DBT, Solution Focused, Strength-based, Supportive, and Reframing   Summary: [name] continued topic of boundaries and introduced how to set and maintain healthy boundaries. [name] utilized handout "how to set boundaries" and group members worked through examples to practice setting appropriate boundaries.    Therapist Response:  Pt engaged in discussion and reports struggle with maintaining boundaries           Session Time: 12:00 -1:00   Participation Level: Active   Behavioral Response: CasualAlertDepressed   Type of Therapy: Group therapy, Occupational Therapy   Treatment Goals addressed: Coping   Progress Towards Goals:  Progressing   Interventions: Supportive; Psychoeducation   Summary: 12:00 - 12:50: Occupational Therapy group led by cln [name]. 12:50 - 1:00 Clinician assessed for immediate needs, medication compliance and efficacy, and safety  concerns.   Therapist Response: 12:00 - 12:50: Pt participated 12:50 - 1:00 pm: At check-out, patient reports no immediate concerns. Patient demonstrates progress as evidenced by continued engagement in group and responsiveness to treatment. Patient denies SI/HI/self-harm thoughts at the end of group.    Suicidal/Homicidal: Nowithout intent/plan   Plan: Pt will continue in PHP while working to decrease depression and anxiety symptoms, increase ADLS, and increase ability to manage symptoms in a healthy manner.    Collaboration of Care: Medication Management AEB [name]  Patient/Guardian was advised Release of Information must be obtained prior to any record release in order to collaborate their care with an outside provider. Patient/Guardian was advised if they have not already done so to contact the registration department to sign all necessary forms in order for [place] to release information regarding their care.   Consent: Patient/Guardian gives verbal consent for treatment and assignment of benefits for services provided during this visit. Patient/Guardian expressed understanding and agreed to proceed.   Diagnosis: MDD (major depressive disorder), recurrent severe, without psychosis ([place]) [F33.2]    1. MDD (major depressive disorder), recurrent severe, without psychosis ([place])   2. Generalized anxiety disorder       Lorin Glass, LCSW 07/14/2022

## 2022-07-14 NOTE — Progress Notes (Signed)
Spoke with patient via WebEx video call, used 2 identifiers to correctly identify patient. States that groups are going well. Feels they are helping her a lot. She is currently doing [employer] to help with bills. 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 Denies SI/HI or AV hallucinations. On scale 1-10 as 10 being worst she rates depression at 0 and anxiety at 0. No issues or complaints. No side effects from medications.

## 2022-07-17 ENCOUNTER — Ambulatory Visit (INDEPENDENT_AMBULATORY_CARE_PROVIDER_SITE_OTHER): Payer: Medicaid Other | Admitting: Licensed Clinical Social Worker

## 2022-07-17 DIAGNOSIS — F332 Major depressive disorder, recurrent severe without psychotic features: Secondary | ICD-10-CM | POA: Diagnosis not present

## 2022-07-17 DIAGNOSIS — F411 Generalized anxiety disorder: Secondary | ICD-10-CM

## 2022-07-17 NOTE — Psych (Signed)
Virtual Visit via Video Note  I connected with Heidi [name] on 07/14/22 at  9:00 AM EST by a video enabled telemedicine application and verified that I am speaking with the correct person using two identifiers.  Location: Patient: patient home Provider: clinical home office   I discussed the limitations of evaluation and management by telemedicine and the availability of in person appointments. The patient expressed understanding and agreed to proceed.  I discussed the assessment and treatment plan with the patient. The patient was provided an opportunity to ask questions and all were answered. The patient agreed with the plan and demonstrated an understanding of the instructions.   The patient was advised to call back or seek an in-person evaluation if the symptoms worsen or if the condition fails to improve as anticipated.  Pt was provided 240 minutes of non-face-to-face time during this encounter.   Lorin Glass, LCSW   [facility] PHP THERAPIST PROGRESS NOTE  Heidi [name] [id number]  Session Time: 9:00 - 10:00  Participation Level: Active  Behavioral Response: CasualAlertDepressed  Type of Therapy: Group Therapy  Treatment Goals addressed: Coping  Progress Towards Goals: Progressing  Interventions: CBT, DBT, Supportive, and Reframing  Summary: Heidi [name] is a 22 y.o. female who presents with depression symptoms.  Clinician led check-in regarding current stressors and situation, and review of patient completed daily inventory. Clinician utilized active listening and empathetic response and validated patient emotions. Clinician facilitated processing group on pertinent issues.?    Therapist Response: Patient arrived within time allowed. Patient rates her mood at a 8.5 on a scale of 1-10 with 10 being best. Pt states she feels "pretty good." Pt states she slept 8-10 hours and ate 3x. Pt reports she got out of the house yesterday which elevated her mood. Pt  states continued struggles with anxiety about her ex. Patient able to process. Patient engaged in discussion.           Session Time: 10:00 am - 11:00 am   Participation Level: Active   Behavioral Response: CasualAlertDepressed   Type of Therapy: Group Therapy   Treatment Goals addressed: Coping   Progress Towards Goals: Progressing   Interventions: CBT, DBT, Solution Focused, Strength-based, Supportive, and Reframing   Therapist Response: [name] facilitated processing group around difficult relationships. Group members shared struggles they face in relationships. [name] brought in topics of self-esteem, CBT thought challenging, boundaries, and communication to aid growth.   Therapist Response: Pt engaged in discussion and is able to process.           Session Time: 11:00 -12:00   Participation Level: Active   Behavioral Response: CasualAlertDepressed   Type of Therapy: Group Therapy   Treatment Goals addressed: Coping   Progress Towards Goals: Progressing   Interventions: CBT, DBT, Solution Focused, Strength-based, Supportive, and Reframing   Summary: [name] continued topic of boundaries and led a "boundary workshop" in which group members brought current boundary issues and group worked together to apply boundary concepts to help address the concern. [name] helped shape conversation to maintain fidelity.    Therapist Response:  Pt engaged in discussion and shared a current boundary issue and reports gaining insight.            Session Time: 12:00 -1:00   Participation Level: Active   Behavioral Response: CasualAlertDepressed   Type of Therapy: Group therapy, Occupational Therapy   Treatment Goals addressed: Coping   Progress Towards Goals: Progressing   Interventions: Supportive; Psychoeducation   Summary: 12:00 -  12:50: Occupational Therapy group led by cln [name]. 12:50 - 1:00 Clinician assessed for immediate needs, medication compliance and efficacy, and  safety concerns.   Therapist Response: 12:00 - 12:50: Pt participated 12:50 - 1:00 pm: At check-out, patient reports no immediate concerns. Patient demonstrates progress as evidenced by continued engagement in group and responsiveness to treatment. Patient denies SI/HI/self-harm thoughts at the end of group.    Suicidal/Homicidal: Nowithout intent/plan   Plan: Pt will continue in PHP while working to decrease depression and anxiety symptoms, increase ADLS, and increase ability to manage symptoms in a healthy manner.    Collaboration of Care: Medication Management AEB [name]  Patient/Guardian was advised Release of Information must be obtained prior to any record release in order to collaborate their care with an outside provider. Patient/Guardian was advised if they have not already done so to contact the registration department to sign all necessary forms in order for [place] to release information regarding their care.   Consent: Patient/Guardian gives verbal consent for treatment and assignment of benefits for services provided during this visit. Patient/Guardian expressed understanding and agreed to proceed.   Diagnosis: MDD (major depressive disorder), recurrent severe, without psychosis ([place]) [F33.2]    1. MDD (major depressive disorder), recurrent severe, without psychosis ([place])   2. Generalized anxiety disorder       Lorin Glass, [place] 07/17/2022

## 2022-07-17 NOTE — Progress Notes (Signed)
Spoke with patient via WebEx video call, used 2 identifiers to correctly identify patient. States that groups are going good. She accidentally skipped her Zoloft yesterday but will take it this morning. No side effects from medications. On scale 1-10 as 10 being worst she rates depression at 0 and anxiety at 0. Denies SI/HI or AV hallucinations. No issues or complaints.

## 2022-07-18 ENCOUNTER — Ambulatory Visit (INDEPENDENT_AMBULATORY_CARE_PROVIDER_SITE_OTHER): Payer: Medicaid Other | Admitting: Licensed Clinical Social Worker

## 2022-07-18 DIAGNOSIS — F411 Generalized anxiety disorder: Secondary | ICD-10-CM

## 2022-07-18 DIAGNOSIS — F332 Major depressive disorder, recurrent severe without psychotic features: Secondary | ICD-10-CM

## 2022-07-18 NOTE — Psych (Signed)
Virtual Visit via Video Note  I connected with Heidi [name] on 07/18/22 at  9:00 AM EST by a video enabled telemedicine application and verified that I am speaking with the correct person using two identifiers.  Location: Patient: patient home Provider: clinical home office   I discussed the limitations of evaluation and management by telemedicine and the availability of in person appointments. The patient expressed understanding and agreed to proceed.  I discussed the assessment and treatment plan with the patient. The patient was provided an opportunity to ask questions and all were answered. The patient agreed with the plan and demonstrated an understanding of the instructions.   The patient was advised to call back or seek an in-person evaluation if the symptoms worsen or if the condition fails to improve as anticipated.  Pt was provided 240 minutes of non-face-to-face time during this encounter.   Lorin Glass, LCSW   [facility] [place] PHP THERAPIST PROGRESS NOTE  Heidi [name] [id number]  Session Time: 9:00 - 10:00  Participation Level: Active  Behavioral Response: CasualAlertDepressed  Type of Therapy: Group Therapy  Treatment Goals addressed: Coping  Progress Towards Goals: Progressing  Interventions: CBT, DBT, Supportive, and Reframing  Summary: Heidi [name] is a 22 y.o. female who presents with depression symptoms.  Clinician led check-in regarding current stressors and situation, and review of patient completed daily inventory. Clinician utilized active listening and empathetic response and validated patient emotions. Clinician facilitated processing group on pertinent issues.?    Therapist Response: Patient arrived within time allowed. Patient rates her mood at a 9 on a scale of 1-10 with 10 being best. Pt states she feels "good." Pt states she slept 6 hours and ate 3x. Pt reports she is gaining confidence in her ability to manage stressors. Pt reports  trying to rely on support system more. Pt states she experienced anxiety when trying to go the store.  Patient able to process. Patient engaged in discussion.           Session Time: 10:00 am - 11:00 am   Participation Level: Active   Behavioral Response: CasualAlertDepressed   Type of Therapy: Group Therapy   Treatment Goals addressed: Coping   Progress Towards Goals: Progressing   Interventions: CBT, DBT, Solution Focused, Strength-based, Supportive, and Reframing   Therapist Response:  [name] led discussion on loss of appetite and the importance of nutrition. [name] discussed the role nutrition can play in emotion regulation. Group members discussed the barriers they have to eating when they are symptomatic and worked to brainstorm ways to work around them.    Therapist Response: Pt states she is currently eating 3x/day and struggles with stress eating.         Session Time: 11:00 -12:00   Participation Level: Active   Behavioral Response: CasualAlertDepressed   Type of Therapy: Group Therapy   Treatment Goals addressed: Coping   Progress Towards Goals: Progressing   Interventions: CBT, DBT, Solution Focused, Strength-based, Supportive, and Reframing   Summary: [name] introduced "I" statements and the ways in which they can improve assertiveness and overall communication. [name] educated group on "I" statement formula and led practice.    Therapist Response: Pt engaged in discussion and demonstrates understanding of how to utilize "I" statements.             Session Time: 12:00 -1:00   Participation Level: Active   Behavioral Response: CasualAlertDepressed   Type of Therapy: Group therapy, Occupational Therapy   Treatment Goals addressed: Coping  Progress Towards Goals: Progressing   Interventions: Supportive; Psychoeducation   Summary: 12:00 - 12:50: Occupational Therapy group led by cln [name]. 12:50 - 1:00 Clinician assessed for immediate needs,  medication compliance and efficacy, and safety concerns.   Therapist Response: 12:00 - 12:50: Pt participated 12:50 - 1:00 pm: At check-out, patient reports no immediate concerns. Patient demonstrates progress as evidenced by continued engagement in group and responsiveness to treatment. Patient denies SI/HI/self-harm thoughts at the end of group.    Suicidal/Homicidal: Nowithout intent/plan   Plan: Pt will continue in PHP while working to decrease depression and anxiety symptoms, increase ADLS, and increase ability to manage symptoms in a healthy manner.    Collaboration of Care: Medication Management AEB [name]  Patient/Guardian was advised Release of Information must be obtained prior to any record release in order to collaborate their care with an outside provider. Patient/Guardian was advised if they have not already done so to contact the registration department to sign all necessary forms in order for [place] to release information regarding their care.   Consent: Patient/Guardian gives verbal consent for treatment and assignment of benefits for services provided during this visit. Patient/Guardian expressed understanding and agreed to proceed.   Diagnosis: MDD (major depressive disorder), recurrent severe, without psychosis ([place]) [F33.2]    1. MDD (major depressive disorder), recurrent severe, without psychosis ([place])   2. Generalized anxiety disorder       Lorin Glass, [place] 07/18/2022

## 2022-07-18 NOTE — Psych (Signed)
Virtual Visit via Video Note  I connected with Heidi [name] on 07/17/22 at  9:00 AM EST by a video enabled telemedicine application and verified that I am speaking with the correct person using two identifiers.  Location: Patient: patient home Provider: clinical home office   I discussed the limitations of evaluation and management by telemedicine and the availability of in person appointments. The patient expressed understanding and agreed to proceed.  I discussed the assessment and treatment plan with the patient. The patient was provided an opportunity to ask questions and all were answered. The patient agreed with the plan and demonstrated an understanding of the instructions.   The patient was advised to call back or seek an in-person evaluation if the symptoms worsen or if the condition fails to improve as anticipated.  Pt was provided 240 minutes of non-face-to-face time during this encounter.   Lorin Glass, LCSW   [facility] PHP THERAPIST PROGRESS NOTE  Heidi [name] [id number]  Session Time: 9:00 - 10:00  Participation Level: Active  Behavioral Response: CasualAlertDepressed  Type of Therapy: Group Therapy  Treatment Goals addressed: Coping  Progress Towards Goals: Progressing  Interventions: CBT, DBT, Supportive, and Reframing  Summary: Heidi [name] is a 22 y.o. female who presents with depression symptoms.  Clinician led check-in regarding current stressors and situation, and review of patient completed daily inventory. Clinician utilized active listening and empathetic response and validated patient emotions. Clinician facilitated processing group on pertinent issues.?    Therapist Response: Patient arrived within time allowed. Patient rates her mood at a 8 on a scale of 1-10 with 10 being best. Pt states she feels "pretty good." Pt states she slept 8 hours and ate 3x. Pt reports she reconnected with a cousin this weekend which went well and she is  glad to add to her support system. Pt states she did not leave the house. Patient able to process. Patient engaged in discussion.           Session Time: 10:00 am - 11:00 am   Participation Level: Active   Behavioral Response: CasualAlertDepressed   Type of Therapy: Group Therapy   Treatment Goals addressed: Coping   Progress Towards Goals: Progressing   Interventions: CBT, DBT, Solution Focused, Strength-based, Supportive, and Reframing   Therapist Response:  [name] continued discussion on topic of boundaries. Group reviewed previous aspects of boundaries discussed. [name] utilized handout "Tips for [employer]" and group members discussed how to apply the tips. [name] shaped conversation and cued for healthy boundary characteristics.    Therapist Response: Pt engaged in discussion and is able to process ways to increase their healthy boundaries.           Session Time: 11:00 -12:00   Participation Level: Active   Behavioral Response: CasualAlertDepressed   Type of Therapy: Group Therapy   Treatment Goals addressed: Coping   Progress Towards Goals: Progressing   Interventions: CBT, DBT, Solution Focused, Strength-based, Supportive, and Reframing   Summary: [name] introduced topic of healthy communication and the role nonverbals play. Group discussed facial expression, tone, body language, and volume and the ways they present. Group members discussed difficulties they've had with nonverbals and feedback they have recieved.    Therapist Response:  Pt engaged in discussion and ranks their communication skills as a 4 on a 1-10 scale with 10 being great communication.              Session Time: 12:00 -1:00   Participation Level: Active  Behavioral Response: CasualAlertDepressed   Type of Therapy: Group therapy, Occupational Therapy   Treatment Goals addressed: Coping   Progress Towards Goals: Progressing   Interventions: Supportive; Psychoeducation   Summary:  12:00 - 12:50: Occupational Therapy group led by cln [name]. 12:50 - 1:00 Clinician assessed for immediate needs, medication compliance and efficacy, and safety concerns.   Therapist Response: 12:00 - 12:50: Pt participated 12:50 - 1:00 pm: At check-out, patient reports no immediate concerns. Patient demonstrates progress as evidenced by continued engagement in group and responsiveness to treatment. Patient denies SI/HI/self-harm thoughts at the end of group.    Suicidal/Homicidal: Nowithout intent/plan   Plan: Pt will continue in PHP while working to decrease depression and anxiety symptoms, increase ADLS, and increase ability to manage symptoms in a healthy manner.    Collaboration of Care: Medication Management AEB [name]  Patient/Guardian was advised Release of Information must be obtained prior to any record release in order to collaborate their care with an outside provider. Patient/Guardian was advised if they have not already done so to contact the registration department to sign all necessary forms in order for [place] to release information regarding their care.   Consent: Patient/Guardian gives verbal consent for treatment and assignment of benefits for services provided during this visit. Patient/Guardian expressed understanding and agreed to proceed.   Diagnosis: MDD (major depressive disorder), recurrent severe, without psychosis ([place]) [F33.2]    1. MDD (major depressive disorder), recurrent severe, without psychosis ([place])   2. Generalized anxiety disorder       Lorin Glass, [place] 07/18/2022

## 2022-07-19 ENCOUNTER — Ambulatory Visit (INDEPENDENT_AMBULATORY_CARE_PROVIDER_SITE_OTHER): Payer: Medicaid Other | Admitting: Licensed Clinical Social Worker

## 2022-07-19 DIAGNOSIS — F411 Generalized anxiety disorder: Secondary | ICD-10-CM

## 2022-07-19 DIAGNOSIS — F332 Major depressive disorder, recurrent severe without psychotic features: Secondary | ICD-10-CM | POA: Diagnosis not present

## 2022-07-20 ENCOUNTER — Ambulatory Visit (INDEPENDENT_AMBULATORY_CARE_PROVIDER_SITE_OTHER): Payer: Medicaid Other | Admitting: Licensed Clinical Social Worker

## 2022-07-20 ENCOUNTER — Encounter (HOSPITAL_COMMUNITY): Payer: Self-pay

## 2022-07-20 DIAGNOSIS — F332 Major depressive disorder, recurrent severe without psychotic features: Secondary | ICD-10-CM

## 2022-07-20 DIAGNOSIS — F411 Generalized anxiety disorder: Secondary | ICD-10-CM

## 2022-07-20 MED ORDER — TRAZODONE HCL 100 MG PO TABS: 100.0000 mg | ORAL_TABLET | Freq: Every day | ORAL | 0 refills | Status: DC

## 2022-07-20 MED ORDER — HYDROXYZINE HCL 25 MG PO TABS: 25.0000 mg | ORAL_TABLET | Freq: Three times a day (TID) | ORAL | 0 refills | Status: DC | PRN

## 2022-07-20 MED ORDER — SERTRALINE HCL 100 MG PO TABS: 100.0000 mg | ORAL_TABLET | Freq: Every day | ORAL | 0 refills | Status: DC

## 2022-07-20 NOTE — Progress Notes (Signed)
[facility] Partial Hospitalization Program Psych Discharge Summary   Virtual Visit via Video Note  I connected with Heidi [name] on 07/20/22 at  9:00 AM EST by a video enabled telemedicine application and verified that I am speaking with the correct person using two identifiers.  Location: Patient: Home Provider: [facility]   I discussed the limitations of evaluation and management by telemedicine and the availability of in person appointments. The patient expressed understanding and agreed to proceed.  Heidi [name] [id number]  Admission date: 07/04/2022 Discharge date: 07/21/2022  Reason for admission:  She reports that since her hospitalization she has been doing okay. She reports that she still has some anxiety but has not had any panic attacks and overall it has been better than before her hospitalization. She reports that she is just trying to get back into the world. She reports that sometimes she still does not feel like herself but overall is doing better. She reports the medications are helping her. She reports no side effects to them.   Progress in Program Toward Treatment Goals: Progressing  Progress (rationale):  Heidi [name] is a 22 yr old female who presents via Virtual Video Visit for Follow Up and Medication Management, she enrolled in the [facility] Program 07/04/2022.  PPHx is significant for MDD, Recurrent, Severe, w/out Psychosis and GAD, and 3 Suicide Attempts (all cutting wrist, last 05/2022) and 1 Prior Psychiatric Hospitalization [facility] 05/2022).    She reports that the [facility] program has been beneficial for her.  She reports that she has learned a lot of coping skills which she will continue to use.  She reports that her medications are doing a good job controlling her depression and anxiety.  She reports no side effects from her medications.  She reports overall she feels much better.  Discussed with her that referrals to [facility] for therapy and medication management has  been made.  Encouraged her to continue with daily check ins after discharge patient agreeable with this.  She reports no SI, HI, or AVH.  She reports her sleep is good.  She reports her appetite is doing good.  She reports no other concerns at present.   Psychiatric Specialty Exam:   Review of Systems  Respiratory:  Negative for shortness of breath.   Cardiovascular:  Negative for chest pain.  Gastrointestinal:  Negative for abdominal pain, constipation, diarrhea, nausea and vomiting.  Neurological:  Negative for dizziness, weakness and headaches.  Psychiatric/Behavioral:  Negative for dysphoric mood, hallucinations, sleep disturbance and suicidal ideas. The patient is not nervous/anxious.     There were no vitals taken for this visit.There is no height or weight on file to calculate BMI.  General Appearance: Casual and Fairly Groomed  Eye Contact:  Good  Speech:  Clear and Coherent and Normal Rate  Volume:  Normal  Mood:   "ok"  Affect:  Appropriate and Congruent  Thought Process:  Coherent and Goal Directed  Orientation:  Full (Time, Place, and Person)  Thought Content:  WDL and Logical  Suicidal Thoughts:  No  Homicidal Thoughts:  No  Memory:  Immediate;   Good Recent;   Good  Judgement:  Good  Insight:  Good  Psychomotor Activity:  Normal  Concentration:  Concentration: Good and Attention Span: Good  Recall:  Good  Fund of Knowledge:  Good  Language:  Good  Akathisia:  Negative  Handed:  Right  AIMS (if indicated):   Not done  Assets:  Communication Skills Desire for  Improvement Housing Physical Health Resilience Social Support  ADL's:  Intact  Cognition:  WNL  Sleep:   Good      Discharge Plan: Referrals to [facility] for outpatient therapy and medication management.  Collaboration of Care: Other PHP Program  Patient/Guardian was advised Release of Information must be obtained prior to any record release in order to collaborate their care with an outside provider.  Patient/Guardian was advised if they have not already done so to contact the registration department to sign all necessary forms in order for [place] to release information regarding their care.   Consent: Patient/Guardian gives verbal consent for treatment and assignment of benefits for services provided during this visit. Patient/Guardian expressed understanding and agreed to proceed.   [facility]-PHP THERAPIST 07/20/2022  Follow Up Instructions:    I discussed the assessment and treatment plan with the patient. The patient was provided an opportunity to ask questions and all were answered. The patient agreed with the plan and demonstrated an understanding of the instructions.   The patient was advised to call back or seek an in-person evaluation if the symptoms worsen or if the condition fails to improve as anticipated.  I provided 15 minutes of non-face-to-face time during this encounter.   Briant Cedar, MD

## 2022-07-21 ENCOUNTER — Ambulatory Visit (INDEPENDENT_AMBULATORY_CARE_PROVIDER_SITE_OTHER): Payer: Medicaid Other | Admitting: Licensed Clinical Social Worker

## 2022-07-21 DIAGNOSIS — F411 Generalized anxiety disorder: Secondary | ICD-10-CM

## 2022-07-21 DIAGNOSIS — F332 Major depressive disorder, recurrent severe without psychotic features: Secondary | ICD-10-CM | POA: Diagnosis not present

## 2022-07-21 NOTE — Psych (Signed)
Virtual Visit via Video Note  I connected with Heidi [name] on 07/19/22 at  9:00 AM EST by a video enabled telemedicine application and verified that I am speaking with the correct person using two identifiers.  Location: Patient: patient home Provider: clinical home office   I discussed the limitations of evaluation and management by telemedicine and the availability of in person appointments. The patient expressed understanding and agreed to proceed.  I discussed the assessment and treatment plan with the patient. The patient was provided an opportunity to ask questions and all were answered. The patient agreed with the plan and demonstrated an understanding of the instructions.   The patient was advised to call back or seek an in-person evaluation if the symptoms worsen or if the condition fails to improve as anticipated.  Pt was provided 240 minutes of non-face-to-face time during this encounter.   Lorin Glass, LCSW   [facility] PHP THERAPIST PROGRESS NOTE  Heidi [name] [id number]  Session Time: 9:00 - 10:00  Participation Level: Active  Behavioral Response: CasualAlertDepressed  Type of Therapy: Group Therapy  Treatment Goals addressed: Coping  Progress Towards Goals: Progressing  Interventions: CBT, DBT, Supportive, and Reframing  Summary: Heidi [name] is a 22 y.o. female who presents with depression symptoms.  Clinician led check-in regarding current stressors and situation, and review of patient completed daily inventory. Clinician utilized active listening and empathetic response and validated patient emotions. Clinician facilitated processing group on pertinent issues.?    Therapist Response: Patient arrived within time allowed. Patient rates her mood at a 8.5 on a scale of 1-10 with 10 being best. Pt states she feels "normal." Pt states she slept 7 hours and ate 2x. Pt reports struggling with staying awake. Pt reports frustration with tasks being  more difficult than they normally are for her. Pt reports stress re: her ex and him reaching out to her via social media. Patient able to process. Patient engaged in discussion.           Session Time: 10:00 am - 11:00 am   Participation Level: Active   Behavioral Response: CasualAlertDepressed   Type of Therapy: Group Therapy   Treatment Goals addressed: Coping   Progress Towards Goals: Progressing   Interventions: CBT, DBT, Solution Focused, Strength-based, Supportive, and Reframing   Therapist Response: [name] led processing group for pt's current struggles. Group members shared stressors and provided support and feedback. [name] brought in topics of boundaries, healthy relationships, and unhealthy thought processes to inform discussion.    Therapist Response:  Pt able to process and provide support to group.          Session Time: 11:00 -12:00   Participation Level: Active   Behavioral Response: CasualAlertDepressed   Type of Therapy: Group Therapy, Spiritual Care   Treatment Goals addressed: Coping   Progress Towards Goals: Progressing   Interventions: Supportive, Education   Summary:  Heidi [name], Chaplain, led group.   Therapist Response: Pt participated             Session Time: 12:00 -1:00   Participation Level: Active   Behavioral Response: CasualAlertDepressed   Type of Therapy: Group therapy, Occupational Therapy   Treatment Goals addressed: Coping   Progress Towards Goals: Progressing   Interventions: Supportive; Psychoeducation   Summary: 12:00 - 12:50: Occupational Therapy group led by cln [name]. 12:50 - 1:00 Clinician assessed for immediate needs, medication compliance and efficacy, and safety concerns.   Therapist Response: 12:00 - 12:50: Pt participated  12:50 - 1:00 pm: At check-out, patient reports no immediate concerns. Patient demonstrates progress as evidenced by continued engagement in group and responsiveness to treatment.  Patient denies SI/HI/self-harm thoughts at the end of group.    Suicidal/Homicidal: Nowithout intent/plan   Plan: Pt will continue in PHP while working to decrease depression and anxiety symptoms, increase ADLS, and increase ability to manage symptoms in a healthy manner.    Collaboration of Care: Medication Management AEB [name]  Patient/Guardian was advised Release of Information must be obtained prior to any record release in order to collaborate their care with an outside provider. Patient/Guardian was advised if they have not already done so to contact the registration department to sign all necessary forms in order for [place] to release information regarding their care.   Consent: Patient/Guardian gives verbal consent for treatment and assignment of benefits for services provided during this visit. Patient/Guardian expressed understanding and agreed to proceed.   Diagnosis: MDD (major depressive disorder), recurrent severe, without psychosis ([place]) [F33.2]    1. MDD (major depressive disorder), recurrent severe, without psychosis ([place])   2. Generalized anxiety disorder       Lorin Glass, [place] 07/21/2022

## 2022-07-21 NOTE — Psych (Signed)
Virtual Visit via Video Note  I connected with Heidi [name] on 07/20/22 at  9:00 AM EST by a video enabled telemedicine application and verified that I am speaking with the correct person using two identifiers.  Location: Patient: patient home Provider: clinical home office   I discussed the limitations of evaluation and management by telemedicine and the availability of in person appointments. The patient expressed understanding and agreed to proceed.  I discussed the assessment and treatment plan with the patient. The patient was provided an opportunity to ask questions and all were answered. The patient agreed with the plan and demonstrated an understanding of the instructions.   The patient was advised to call back or seek an in-person evaluation if the symptoms worsen or if the condition fails to improve as anticipated.  Pt was provided 240 minutes of non-face-to-face time during this encounter.   Lorin Glass, LCSW   [facility] [place] PHP THERAPIST PROGRESS NOTE  TIMMIA COGBURN [id number]  Session Time: 9:00 - 10:00  Participation Level: Active  Behavioral Response: CasualAlertDepressed  Type of Therapy: Group Therapy  Treatment Goals addressed: Coping  Progress Towards Goals: Progressing  Interventions: CBT, DBT, Supportive, and Reframing  Summary: Heidi [name] is a 22 y.o. female who presents with depression symptoms.  Clinician led check-in regarding current stressors and situation, and review of patient completed daily inventory. Clinician utilized active listening and empathetic response and validated patient emotions. Clinician facilitated processing group on pertinent issues.?    Therapist Response: Patient arrived within time allowed. Patient rates her mood at a 8.5 on a scale of 1-10 with 10 being best. Pt states she feels "more energetic." Pt states she slept 8.5 hours and ate 2x. Pt reports continued struggles with staying awake. Pt reports her ex  reached out officially yesterday and pt set a boundary that she was proud of. Pt states feeling "strong" and "determined." Pt states rescheduling a friend date because she felt drained. Patient able to process. Patient engaged in discussion.           Session Time: 10:00 am - 11:00 am   Participation Level: Active   Behavioral Response: CasualAlertDepressed   Type of Therapy: Group Therapy   Treatment Goals addressed: Coping   Progress Towards Goals: Progressing   Interventions: CBT, DBT, Solution Focused, Strength-based, Supportive, and Reframing   Therapist Response: [name] led processing group for pt's current struggles. Group members shared stressors and provided support and feedback. [name] brought in topics of boundaries, healthy relationships, and unhealthy thought processes to inform discussion.    Therapist Response: Pt able to process and provide support to group.          Session Time: 11:00 -12:00   Participation Level: Active   Behavioral Response: CasualAlertDepressed   Type of Therapy: Group Therapy   Treatment Goals addressed: Coping   Progress Towards Goals: Progressing   Interventions: CBT, DBT, Solution Focused, Strength-based, Supportive, and Reframing   Summary: [name] led discussion on communication struggles. Group shared ways in which communication is difficult for them and what are typical barriers. [name] reminded pt's of assertiveness skills and "I" statements. [name] encouraged pt's to consider making notes for points they want to address in a discussion and/or script out what they want to say as a way to decrease anxiety and likeliness that they will get off topic.    Therapist Response: Pt engaged in discussion and reports willingness to apply communication strategies  Session Time: 12:00 -1:00   Participation Level: Active   Behavioral Response: CasualAlertDepressed   Type of Therapy: Group therapy, Occupational Therapy   Treatment  Goals addressed: Coping   Progress Towards Goals: Progressing   Interventions: Supportive; Psychoeducation   Summary: 12:00 - 12:50: Occupational Therapy group led by cln [name]. 12:50 - 1:00 Clinician assessed for immediate needs, medication compliance and efficacy, and safety concerns.   Therapist Response: 12:00 - 12:50: Pt participated 12:50 - 1:00 pm: At check-out, patient reports no immediate concerns. Patient demonstrates progress as evidenced by continued engagement in group and responsiveness to treatment. Patient denies SI/HI/self-harm thoughts at the end of group.    Suicidal/Homicidal: Nowithout intent/plan   Plan: Pt will continue in PHP while working to decrease depression and anxiety symptoms, increase ADLS, and increase ability to manage symptoms in a healthy manner.    Collaboration of Care: Medication Management AEB [name]  Patient/Guardian was advised Release of Information must be obtained prior to any record release in order to collaborate their care with an outside provider. Patient/Guardian was advised if they have not already done so to contact the registration department to sign all necessary forms in order for [place] to release information regarding their care.   Consent: Patient/Guardian gives verbal consent for treatment and assignment of benefits for services provided during this visit. Patient/Guardian expressed understanding and agreed to proceed.   Diagnosis: MDD (major depressive disorder), recurrent severe, without psychosis ([place]) [F33.2]    1. MDD (major depressive disorder), recurrent severe, without psychosis ([place])   2. Generalized anxiety disorder       Lorin Glass, [place] 07/21/2022

## 2022-07-24 ENCOUNTER — Encounter (HOSPITAL_COMMUNITY): Payer: Self-pay

## 2022-07-24 ENCOUNTER — Ambulatory Visit (HOSPITAL_COMMUNITY): Payer: Medicaid Other

## 2022-07-25 ENCOUNTER — Ambulatory Visit (HOSPITAL_COMMUNITY): Payer: Medicaid Other

## 2022-07-25 NOTE — Psych (Signed)
Virtual Visit via Video Note  I connected with Heidi [name] on 07/21/22 at  9:00 AM EST by a video enabled telemedicine application and verified that I am speaking with the correct person using two identifiers.  Location: Patient: patient home Provider: clinical home office   I discussed the limitations of evaluation and management by telemedicine and the availability of in person appointments. The patient expressed understanding and agreed to proceed.  I discussed the assessment and treatment plan with the patient. The patient was provided an opportunity to ask questions and all were answered. The patient agreed with the plan and demonstrated an understanding of the instructions.   The patient was advised to call back or seek an in-person evaluation if the symptoms worsen or if the condition fails to improve as anticipated.  Pt was provided 240 minutes of non-face-to-face time during this encounter.   Lorin Glass, LCSW   [facility] [place] PHP THERAPIST PROGRESS NOTE  Heidi [name] [id number]  Session Time: 9:00 - 10:00  Participation Level: Active  Behavioral Response: CasualAlertDepressed  Type of Therapy: Group Therapy  Treatment Goals addressed: Coping  Progress Towards Goals: Progressing  Interventions: CBT, DBT, Supportive, and Reframing  Summary: Heidi [name] is a 22 y.o. female who presents with depression symptoms.  Clinician led check-in regarding current stressors and situation, and review of patient completed daily inventory. Clinician utilized active listening and empathetic response and validated patient emotions. Clinician facilitated processing group on pertinent issues.?    Therapist Response: Patient arrived within time allowed. Patient rates her mood at a 10 on a scale of 1-10 with 10 being best. Pt states she feels "hopeful." Pt states she slept 8 hours and ate 2x. Pt reports feeling "proud" of her discharge today and feeling she is in a more  competent and healthy place. Pt reports feeling tired and like she needs to catch up on sleep. Patient able to process. Patient engaged in discussion.           Session Time: 10:00 am - 11:00 am   Participation Level: Active   Behavioral Response: CasualAlertDepressed   Type of Therapy: Group Therapy   Treatment Goals addressed: Coping   Progress Towards Goals: Progressing   Interventions: CBT, DBT, Solution Focused, Strength-based, Supportive, and Reframing   Therapist Response: [name] led processing group for pt's current struggles. Group members shared stressors and provided support and feedback. [name] brought in topics of boundaries, healthy relationships, and unhealthy thought processes to inform discussion.    Therapist Response: Pt able to process and provide support to group.          Session Time: 11:00 -12:00   Participation Level: Active   Behavioral Response: CasualAlertDepressed   Type of Therapy: Group Therapy   Treatment Goals addressed: Coping   Progress Towards Goals: Progressing   Interventions: CBT, DBT, Solution Focused, Strength-based, Supportive, and Reframing   Summary:  [name] continued discussion on communication and utilized hand out "[employer]" to discuss ways to communicate in difficult or delicate situations. Group discussed the strategies and which were most problematic for them. [name] shaped discussion and provided clarity and guidance in how to apply.    Therapist Response: Pt engaged in discussion           Session Time: 12:00 -1:00   Participation Level: Active   Behavioral Response: CasualAlertDepressed   Type of Therapy: Group therapy, Occupational Therapy   Treatment Goals addressed: Coping   Progress Towards Goals: Progressing  Interventions: Supportive; Psychoeducation   Summary: 12:00 - 12:50: Occupational Therapy group led by cln [name]. 12:50 - 1:00 Clinician assessed for immediate needs, medication  compliance and efficacy, and safety concerns.   Therapist Response: 12:00 - 12:50: Pt participated 12:50 - 1:00 pm: At check-out, patient reports no immediate concerns. Patient demonstrates progress as evidenced by continued engagement in group and responsiveness to treatment. Patient denies SI/HI/self-harm thoughts at the end of group.    Suicidal/Homicidal: Nowithout intent/plan   Plan: Pt will discharge from PHP due to meeting treatment goals of decreased depression and anxiety symptoms, increased ADLS, and increased ability to manage symptoms in a healthy manner.    Collaboration of Care: Medication Management AEB [name]  Patient/Guardian was advised Release of Information must be obtained prior to any record release in order to collaborate their care with an outside provider. Patient/Guardian was advised if they have not already done so to contact the registration department to sign all necessary forms in order for [place] to release information regarding their care.   Consent: Patient/Guardian gives verbal consent for treatment and assignment of benefits for services provided during this visit. Patient/Guardian expressed understanding and agreed to proceed.   Diagnosis: MDD (major depressive disorder), recurrent severe, without psychosis ([place]) [F33.2]    1. MDD (major depressive disorder), recurrent severe, without psychosis ([place])   2. Generalized anxiety disorder       Lorin Glass, LCSW 07/25/2022

## 2022-07-26 ENCOUNTER — Ambulatory Visit (HOSPITAL_COMMUNITY): Payer: Medicaid Other

## 2022-07-27 ENCOUNTER — Ambulatory Visit (INDEPENDENT_AMBULATORY_CARE_PROVIDER_SITE_OTHER): Payer: Medicaid Other | Admitting: Physician Assistant

## 2022-07-27 ENCOUNTER — Encounter (HOSPITAL_COMMUNITY): Payer: Self-pay | Admitting: Physician Assistant

## 2022-07-27 DIAGNOSIS — K219 Gastro-esophageal reflux disease without esophagitis: Secondary | ICD-10-CM | POA: Diagnosis not present

## 2022-07-27 DIAGNOSIS — F332 Major depressive disorder, recurrent severe without psychotic features: Secondary | ICD-10-CM | POA: Diagnosis not present

## 2022-07-27 DIAGNOSIS — F5105 Insomnia due to other mental disorder: Secondary | ICD-10-CM

## 2022-07-27 DIAGNOSIS — F411 Generalized anxiety disorder: Secondary | ICD-10-CM | POA: Diagnosis not present

## 2022-07-27 DIAGNOSIS — F99 Mental disorder, not otherwise specified: Secondary | ICD-10-CM

## 2022-07-27 MED ORDER — HYDROXYZINE HCL 25 MG PO TABS: 25.0000 mg | ORAL_TABLET | Freq: Three times a day (TID) | ORAL | 1 refills | Status: AC | PRN

## 2022-07-27 MED ORDER — SERTRALINE HCL 100 MG PO TABS: 100.0000 mg | ORAL_TABLET | Freq: Every day | ORAL | 1 refills | Status: AC

## 2022-07-27 MED ORDER — PANTOPRAZOLE SODIUM 20 MG PO TBEC: 20.0000 mg | DELAYED_RELEASE_TABLET | Freq: Every day | ORAL | 0 refills | Status: AC

## 2022-07-27 MED ORDER — MELATONIN 3 MG PO TABS: 3.0000 mg | ORAL_TABLET | Freq: Every day | ORAL | 1 refills | Status: AC

## 2022-07-27 MED ORDER — TRAZODONE HCL 100 MG PO TABS: 100.0000 mg | ORAL_TABLET | Freq: Every day | ORAL | 1 refills | Status: AC
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
## 2022-08-14 ENCOUNTER — Ambulatory Visit (INDEPENDENT_AMBULATORY_CARE_PROVIDER_SITE_OTHER): Payer: Medicaid Other | Admitting: Mental Health

## 2022-08-14 ENCOUNTER — Encounter (HOSPITAL_COMMUNITY): Payer: Self-pay

## 2022-08-14 DIAGNOSIS — F332 Major depressive disorder, recurrent severe without psychotic features: Secondary | ICD-10-CM | POA: Diagnosis not present

## 2022-08-14 DIAGNOSIS — F411 Generalized anxiety disorder: Secondary | ICD-10-CM
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
## 2022-08-15 ENCOUNTER — Ambulatory Visit: Payer: Medicaid Other | Admitting: Nurse Practitioner

## 2022-08-15 ENCOUNTER — Encounter: Payer: Self-pay | Admitting: Nurse Practitioner

## 2022-08-15 VITALS — BP 106/57 | HR 72 | Temp 97.7°F | Ht 61.0 in | Wt 209.4 lb

## 2022-08-15 DIAGNOSIS — Z1329 Encounter for screening for other suspected endocrine disorder: Secondary | ICD-10-CM

## 2022-08-15 DIAGNOSIS — Z0000 Encounter for general adult medical examination without abnormal findings: Secondary | ICD-10-CM | POA: Diagnosis not present

## 2022-08-15 DIAGNOSIS — Z130 Encounter for screening for diseases of the blood and blood-forming organs and certain disorders involving the immune mechanism: Secondary | ICD-10-CM

## 2022-08-15 DIAGNOSIS — E66812 Obesity, class 2: Secondary | ICD-10-CM | POA: Insufficient documentation

## 2022-08-15 DIAGNOSIS — Z23 Encounter for immunization: Secondary | ICD-10-CM

## 2022-08-15 DIAGNOSIS — Z6839 Body mass index (BMI) 39.0-39.9, adult: Secondary | ICD-10-CM

## 2022-08-15 DIAGNOSIS — Z13228 Encounter for screening for other metabolic disorders: Secondary | ICD-10-CM

## 2022-08-15 DIAGNOSIS — Z020 Encounter for examination for admission to educational institution: Secondary | ICD-10-CM

## 2022-08-15 DIAGNOSIS — Z1321 Encounter for screening for nutritional disorder: Secondary | ICD-10-CM

## 2022-08-15 NOTE — Assessment & Plan Note (Signed)
Wt Readings from Last 3 Encounters:  08/15/22 209 lb 6.4 oz (95 kg)  06/23/22 197 lb (89.4 kg)  12/03/21 192 lb (87.1 kg)  Patient counseled on low-carb modified diet Encouraged to engage in regular moderate to vigorous exercise with at least 150 minutes weekly Benefits of healthy weight discussed

## 2022-08-15 NOTE — Assessment & Plan Note (Signed)
Annual exam as documented.  Counseling done include healthy lifestyle involving committing to 150 minutes of exercise per week, heart healthy diet, and attaining healthy weight. The importance of adequate sleep also discussed.  Regular use of seat belt and home safety were also discussed . Changes in health habits are decided on by patient with goals and time frames set for achieving them. Immunization and cancer screening  needs are specifically addressed at this visit.   Cervical Pap exam in 1 month, flu vaccine given in the office today.  Routine labs ordered

## 2022-08-15 NOTE — Progress Notes (Addendum)
Complete physical exam  Patient: Heidi [name]   DOB: [birth year]   22 y.o. Female  MRN: [id number]  Subjective:    Chief Complaint  Patient presents with   Establish Care    Heidi [name] is a 22 y.o. female with past medical history of generalized anxiety disorder, GERD, major depressive disorder, insomnia due to other mental disorder, obesity who presents to establish care for her chronic medical conditions and  for a complete physical exam. She reports consuming a general diet, does walking exercises daily. She generally feels well.   Wears prescription glasses last eye exam was years ago.   Flu vaccine given today Patient will return in 1 month for Pap exam.  starting a new job soon and needs blood work done to verify her immunity against Hep B varicella, MMR   She has been going to the [facility] for her major depressive disorder, anxiety, insomnia.     Most recent fall risk assessment:     No data to display           Most recent depression screenings:    08/15/2022    9:37 AM 08/14/2022    9:14 AM  PHQ 2/9 Scores  PHQ - 2 Score 0   PHQ- 9 Score       Information is confidential and restricted. Go to Review Flowsheets to unlock data.        Patient Care Team: Patient, No Pcp Per as PCP - General (General Practice)   Outpatient Medications Prior to Visit  Medication Sig   hydrOXYzine (ATARAX) 25 MG tablet Take 1 tablet (25 mg total) by mouth every 8 (eight) hours as needed for anxiety.   melatonin 3 MG TABS tablet Take 1 tablet (3 mg total) by mouth at bedtime.   pantoprazole (PROTONIX) 20 MG tablet Take 1 tablet (20 mg total) by mouth daily.   sertraline (ZOLOFT) 100 MG tablet Take 1 tablet (100 mg total) by mouth daily.   traZODone (DESYREL) 100 MG tablet Take 1 tablet (100 mg total) by mouth at bedtime.   No facility-administered medications prior to visit.    Review of Systems  Constitutional: Negative.    HENT: Negative.    Eyes: Negative.  Negative for pain, discharge and redness.  Respiratory: Negative.    Cardiovascular: Negative.   Gastrointestinal: Negative.   Genitourinary: Negative.   Musculoskeletal: Negative.   Skin: Negative.   Neurological: Negative.   Endo/Heme/Allergies: Negative.   Psychiatric/Behavioral: Negative.  Negative for hallucinations, memory loss, substance abuse and suicidal ideas.           Objective:     BP (!) 106/57   Pulse 72   Temp 97.7 F (36.5 C)   Ht '5\' 1"'$  (1.549 m)   Wt 209 lb 6.4 oz (95 kg)   SpO2 100%   BMI 39.57 kg/m    Physical Exam Constitutional:      General: She is not in acute distress.    Appearance: Normal appearance. She is obese. She is not ill-appearing, toxic-appearing or diaphoretic.  HENT:     Head: Normocephalic and atraumatic.     Right Ear: Tympanic membrane, ear canal and external ear normal. There is no impacted cerumen.     Left Ear: Tympanic membrane, ear canal and external ear normal. There is no impacted cerumen.     Nose: Nose normal. No congestion or rhinorrhea.     Mouth/Throat:     Mouth: Mucous  membranes are moist.     Pharynx: Oropharynx is clear. No oropharyngeal exudate or posterior oropharyngeal erythema.  Eyes:     General: No scleral icterus.       Right eye: No discharge.        Left eye: No discharge.     Extraocular Movements: Extraocular movements intact.     Conjunctiva/sclera: Conjunctivae normal.  Neck:     Vascular: No carotid bruit.  Cardiovascular:     Rate and Rhythm: Normal rate and regular rhythm.     Pulses: Normal pulses.     Heart sounds: Normal heart sounds. No murmur heard.    No friction rub. No gallop.  Pulmonary:     Effort: Pulmonary effort is normal. No respiratory distress.     Breath sounds: Normal breath sounds. No stridor. No wheezing, rhonchi or rales.  Chest:     Chest wall: No tenderness.  Abdominal:     General: There is no distension.     Palpations:  Abdomen is soft. There is no mass.     Tenderness: There is no abdominal tenderness. There is no right CVA tenderness, left CVA tenderness, guarding or rebound.     Hernia: No hernia is present.  Musculoskeletal:        General: No swelling, tenderness, deformity or signs of injury.     Cervical back: Normal range of motion and neck supple. No rigidity or tenderness.     Right lower leg: No edema.     Left lower leg: No edema.  Lymphadenopathy:     Cervical: No cervical adenopathy.  Skin:    General: Skin is warm and dry.     Capillary Refill: Capillary refill takes less than 2 seconds.     Coloration: Skin is not jaundiced or pale.     Findings: No bruising, erythema, lesion or rash.  Neurological:     Mental Status: She is alert and oriented to person, place, and time.     Cranial Nerves: No cranial nerve deficit.     Sensory: No sensory deficit.     Motor: No weakness.     Coordination: Coordination normal.     Gait: Gait normal.     Deep Tendon Reflexes: Reflexes normal.  Psychiatric:        Mood and Affect: Mood normal.        Behavior: Behavior normal.        Thought Content: Thought content normal.        Judgment: Judgment normal.      Results for orders placed or performed in visit on 08/15/22  Lipid Panel  Result Value Ref Range   Cholesterol, Total 233 (H) 100 - 199 mg/dL   Triglycerides 39 0 - 149 mg/dL   HDL 45 >39 mg/dL   VLDL Cholesterol Cal 6 5 - 40 mg/dL   LDL Chol Calc (NIH) 182 (H) 0 - 99 mg/dL   Chol/HDL Ratio 5.2 (H) 0.0 - 4.4 ratio  Hemoglobin A1c  Result Value Ref Range   Hgb A1c MFr Bld 6.0 (H) 4.8 - 5.6 %   Est. average glucose Bld gHb Est-mCnc 126 mg/dL  TSH  Result Value Ref Range   TSH 0.701 0.450 - 4.500 uIU/mL  Vitamin D, 25-hydroxy  Result Value Ref Range   Vit D, 25-Hydroxy 8.9 (L) 30.0 - 100.0 ng/mL  Hepatitis C antibody  Result Value Ref Range   Hep C Virus Ab Non Reactive Non Reactive  Hepatitis B core antibody,  IgM  Result  Value Ref Range   Hep B C IgM Negative Negative  Varicella zoster antibody, IgG  Result Value Ref Range   Varicella zoster IgG <135 (L) Immune >165 index  Measles/Mumps/Rubella Immunity  Result Value Ref Range   Rubella Antibodies, IGG 2.78 Immune >0.99 index   RUBEOLA AB, IGG 204.0 Immune >16.4 AU/mL   MUMPS ABS, IGG 78.8 Immune >10.9 AU/mL  Hepatitis B surface antibody,qualitative  Result Value Ref Range   Hep B Surface Ab, Qual Non Reactive   Hepatitis B core antibody, total  Result Value Ref Range   Hep B Core Total Ab Negative Negative  Specimen status report  Result Value Ref Range   specimen status report Comment        Assessment & Plan:    Routine Health Maintenance and Physical Exam  Immunization History  Administered Date(s) Administered   Influenza,inj,Quad PF,6+ Mos 08/15/2022   Pfizer Covid-19 Vaccine Bivalent Booster 43yr & up 12/23/2021   Tdap 07/29/2019, 06/23/2022   Unspecified SARS-COV-2 Vaccination 11/03/2021    Health Maintenance  Topic Date Due   HPV VACCINES (1 - 2-dose series) Never done   CHLAMYDIA SCREENING  10/01/2020   PAP-Cervical Cytology Screening  Never done   PAP SMEAR-Modifier  Never done   COVID-19 Vaccine (3 - 2023-24 season) 02/24/2022   DTaP/Tdap/Td (3 - Td or Tdap) 06/23/2032   INFLUENZA VACCINE  Completed   Hepatitis C Screening  Completed   HIV Screening  Completed    Discussed health benefits of physical activity, and encouraged her to engage in regular exercise appropriate for her age and condition.  Problem List Items Addressed This Visit       Other   Class 2 severe obesity with serious comorbidity in adult ([place]    Wt Readings from Last 3 Encounters:  08/15/22 209 lb 6.4 oz (95 kg)  06/23/22 197 lb (89.4 kg)  12/03/21 192 lb (87.1 kg)  Patient counseled on low-carb modified diet Encouraged to engage in regular moderate to vigorous exercise with at least 150 minutes weekly Benefits of healthy weight discussed       Annual physical exam - Primary    Annual exam as documented.  Counseling done include healthy lifestyle involving committing to 150 minutes of exercise per week, heart healthy diet, and attaining healthy weight. The importance of adequate sleep also discussed.  Regular use of seat belt and home safety were also discussed . Changes in health habits are decided on by patient with goals and time frames set for achieving them. Immunization and cancer screening  needs are specifically addressed at this visit.   Cervical Pap exam in 1 month, flu vaccine given in the office today.  Routine labs ordered      Need for influenza vaccination    Patient educated on CDC recommendation for the influenza vaccine. Verbal consent was obtained from the patient, vaccine administered by nurse, no sign of adverse reactions noted at this time. Patient education on arm soreness and use of tylenol  for this patient  was discussed. Patient educated on the signs and symptoms of adverse effect and advise to contact the office if they occur.       Relevant Orders   Flu Vaccine QUAD 626moM (Fluarix, Fluzone & Alfiuria Quad PF) (Completed)   Other Visit Diagnoses     Screening for endocrine, nutritional, metabolic and immunity disorder       Relevant Orders   Lipid Panel (Completed)   Hemoglobin  A1c (Completed)   TSH (Completed)   Vitamin D, 25-hydroxy (Completed)   Hepatitis C antibody (Completed)   Hepatitis B surface antigen   Hepatitis B core antibody, IgM (Completed)   Hepatitis B surface antibody,quantitative   Measles/Mumps/Rubella Immunity   Varicella zoster antibody, IgG (Completed)   Measles/Mumps/Rubella Immunity (Completed)   Hepatitis B surface antibody,qualitative (Completed)   Hepatitis B core antibody, total (Completed)   Specimen status report (Completed)      Return in about 4 weeks (around 09/12/2022) for PAP.     Renee Rival, FNP

## 2022-08-15 NOTE — Patient Instructions (Signed)
It is important that you exercise regularly at least 30 minutes 5 times a week as tolerated  Think about what you will eat, plan ahead. Choose " clean, green, fresh or frozen" over canned, processed or packaged foods which are more sugary, salty and fatty. 70 to 75% of food eaten should be vegetables and fruit. Three meals at set times with snacks allowed between meals, but they must be fruit or vegetables. Aim to eat over a 12 hour period , example 7 am to 7 pm, and STOP after  your last meal of the day. Drink water,generally about 64 ounces per day, no other drink is as healthy. Fruit juice is best enjoyed in a healthy way, by EATING the fruit.  Thanks for choosing [name] we consider it a privelige to serve you.

## 2022-08-15 NOTE — Assessment & Plan Note (Signed)
Patient educated on CDC recommendation for the influenza vaccine. Verbal consent was obtained from the patient, vaccine administered by nurse, no sign of adverse reactions noted at this time. Patient education on arm soreness and use of tylenol  for this patient  was discussed. Patient educated on the signs and symptoms of adverse effect and advise to contact the office if they occur.

## 2022-08-16 ENCOUNTER — Other Ambulatory Visit: Payer: Self-pay

## 2022-08-16 ENCOUNTER — Other Ambulatory Visit: Payer: Self-pay | Admitting: Nurse Practitioner

## 2022-08-16 DIAGNOSIS — Z020 Encounter for examination for admission to educational institution: Secondary | ICD-10-CM

## 2022-08-16 LAB — HEPATITIS C ANTIBODY: Hep C Virus Ab: NONREACTIVE

## 2022-08-16 LAB — HEMOGLOBIN A1C
Est. average glucose Bld gHb Est-mCnc: 126 mg/dL
Hgb A1c MFr Bld: 6 % — ABNORMAL HIGH (ref 4.8–5.6)

## 2022-08-16 LAB — LIPID PANEL
Chol/HDL Ratio: 5.2 ratio — ABNORMAL HIGH (ref 0.0–4.4)
Cholesterol, Total: 233 mg/dL — ABNORMAL HIGH (ref 100–199)
HDL: 45 mg/dL (ref >39)
LDL Chol Calc (NIH): 182 mg/dL — ABNORMAL HIGH (ref 0–99)
Triglycerides: 39 mg/dL (ref 0–149)
VLDL Cholesterol Cal: 6 mg/dL (ref 5–40)

## 2022-08-16 LAB — TSH: TSH: 0.701 u[IU]/mL (ref 0.450–4.500)

## 2022-08-16 LAB — VARICELLA ZOSTER ANTIBODY, IGG: Varicella zoster IgG: <135 index — ABNORMAL LOW (ref >165)

## 2022-08-16 LAB — VITAMIN D 25 HYDROXY (VIT D DEFICIENCY, FRACTURES): Vit D, 25-Hydroxy: 8.9 ng/mL — ABNORMAL LOW (ref 30.0–100.0)

## 2022-08-16 LAB — HEPATITIS B CORE ANTIBODY, IGM: Hep B C IgM: NEGATIVE

## 2022-08-17 ENCOUNTER — Other Ambulatory Visit: Payer: Self-pay | Admitting: Nurse Practitioner

## 2022-08-17 DIAGNOSIS — E559 Vitamin D deficiency, unspecified: Secondary | ICD-10-CM

## 2022-08-17 MED ORDER — VITAMIN D (ERGOCALCIFEROL) 1.25 MG (50000 UNIT) PO CAPS: 50000.0000 [IU] | ORAL_CAPSULE | ORAL | 0 refills | Status: DC

## 2022-08-17 NOTE — Progress Notes (Signed)
Hyperlipidemia. Eat a healthy diet, including lots of fruits and vegetables. Avoid foods with a lot of saturated and trans fats, such as red meat, butter, fried foods and cheese . Attain a healthy weight.  Prediabetes, avoid sugar , sweet, soda, loose weight.   Vitamin D deficiency . Start taking vitamin D 50,000 units once weekly, after 8 weeks take vitamin D 1000 units daily.    IMMUNIZATIONS  She needs to get the varicella vaccine , she is not immune.  She also needs to get the Hep B vaccine as she is not immnue.  She is good with MMR.   Other labs are normal.

## 2022-08-18 NOTE — Progress Notes (Signed)
Called pt and inform results.

## 2022-09-07 LAB — HEPATITIS B SURFACE ANTIBODY,QUALITATIVE: Hep B Surface Ab, Qual: NONREACTIVE

## 2022-09-07 LAB — HEPATITIS B CORE ANTIBODY, TOTAL: Hep B Core Total Ab: NEGATIVE

## 2022-09-07 LAB — MEASLES/MUMPS/RUBELLA IMMUNITY
MUMPS ABS, IGG: 78.8 AU/mL (ref >10.9)
RUBEOLA AB, IGG: 204 AU/mL (ref >16.4)
Rubella Antibodies, IGG: 2.78 index (ref >0.99)

## 2022-09-07 LAB — SPECIMEN STATUS REPORT

## 2022-09-08 ENCOUNTER — Encounter (HOSPITAL_COMMUNITY): Payer: Self-pay | Admitting: Physician Assistant

## 2022-09-11 ENCOUNTER — Ambulatory Visit (INDEPENDENT_AMBULATORY_CARE_PROVIDER_SITE_OTHER): Payer: Medicaid Other | Admitting: Mental Health

## 2022-09-11 DIAGNOSIS — F411 Generalized anxiety disorder: Secondary | ICD-10-CM

## 2022-09-11 DIAGNOSIS — F332 Major depressive disorder, recurrent severe without psychotic features: Secondary | ICD-10-CM

## 2022-09-11 NOTE — Progress Notes (Signed)
THERAPIST PROGRESS NOTE Virtual Visit via Video Note  I connected with Heidi [name] on 09/11/22 at  9:00 AM EDT by a video enabled telemedicine application and verified that I am speaking with the correct person using two identifiers.  Location: Patient: Heidi [name] Provider: office   I discussed the limitations of evaluation and management by telemedicine and the availability of in person appointments. The patient expressed understanding and agreed to proceed.  I discussed the assessment and treatment plan with the patient. The patient was provided an opportunity to ask questions and all were answered. The patient agreed with the plan and demonstrated an understanding of the instructions.   The patient was advised to call back or seek an in-person evaluation if the symptoms worsen or if the condition fails to improve as anticipated.  I provided 33 minutes of non-face-to-face time during this encounter.   Marion Downer, [facility]   Session Time: 9:03am ( 33 minutes)  Participation Level: Active  Behavioral Response: CasualAlertEuthymic  Type of Therapy: Individual Therapy  Treatment Goals addressed: STG: Heidi [name] will increase management of moods (depression/anger) AEB development of x 3 effective emotional regulation and distress tolerance skills with increased ability to manage stressful events per self report within the next 90 days   ProgressTowards Goals: Initial  Interventions: CBT and Supportive  Summary: Heidi [name] is a 22 y.o. female who presents with dx of major depression and generalized anxiety. Presents for follow up for engagement in outpatient therapy . Shares to be doing well and to be medication compliant. Shares to have been able to engage in behavioral changes in which she has been presenting to the gym, presenting to church and spending time with her son. Shares to feel as if she is in better 'tune with myself.' Shares to have even  worked to repair relationship with her Uncle. Notes in the past for son's father to be a trigger with him wanting a friendship and not working on a relationship with her. Denies for him to check on son and only focused on being her friend. Shares has worked to put in place boundaries with him and has not talked to him lately which has also increased mood. Shares to feel as if she is doing well and reports for sleep and appetite to be within normal levels. Denies concerns for moods at this time. Denies SI/HI. Mood sxs stable at this time. Identifies coping skills.   Suicidal/Homicidal: Nowithout intent/plan  Therapist Response: Therapist engaged Heidi [name] in tele-therapy session, completed check in and assessed for current location and confidentiality of session. Reviewed informed consent and bounds of confidentiality. Assessed for current levl of functioning, sxs management and level of stressors. Assessed for current sxs of depression and anxiety and explored with pt factors that have contributed to increase level of functioning. Explored behavior changes and effect in which behaviors have had on cognitions and presence of unhelpful thinking patterns. Explored signs of when things are changing and sxs are increasing. Engaged in discussing ability to put in place boundaries with son's father and working to make choices that are best for her. Explored use of coping skills and ability to manage emotional responses towards others. Reviewed session and provided supportive feedback. Assessed for safety and provided follow up.   Plan: Return again in  x 4 weeks.  Diagnosis: MDD (major depressive disorder), recurrent severe, without psychosis ([place])  Generalized anxiety disorder  Collaboration of Care: Other None  Patient/Guardian was advised Release of Information must  be obtained prior to any record release in order to collaborate their care with an outside provider. Patient/Guardian was advised if they have  not already done so to contact the registration department to sign all necessary forms in order for [place] to release information regarding their care.   Consent: Patient/Guardian gives verbal consent for treatment and assignment of benefits for services provided during this visit. Patient/Guardian expressed understanding and agreed to proceed.   Rockey Situ Quapaw, [facility] 09/11/2022

## 2022-09-19 ENCOUNTER — Ambulatory Visit (INDEPENDENT_AMBULATORY_CARE_PROVIDER_SITE_OTHER): Payer: Medicaid Other | Admitting: Nurse Practitioner

## 2022-09-19 ENCOUNTER — Encounter: Payer: Self-pay | Admitting: Nurse Practitioner

## 2022-09-19 ENCOUNTER — Other Ambulatory Visit (HOSPITAL_COMMUNITY)
Admission: RE | Admit: 2022-09-19 | Discharge: 2022-09-19 | Disposition: A | Discharge status: Home / Self Care | Payer: Medicaid Other | Source: Ambulatory Visit | Attending: Nurse Practitioner | Admitting: Nurse Practitioner

## 2022-09-19 VITALS — BP 110/58 | HR 74 | Temp 97.4°F | Ht 61.0 in | Wt 213.2 lb

## 2022-09-19 DIAGNOSIS — E559 Vitamin D deficiency, unspecified: Secondary | ICD-10-CM | POA: Diagnosis not present

## 2022-09-19 DIAGNOSIS — Z124 Encounter for screening for malignant neoplasm of cervix: Secondary | ICD-10-CM | POA: Diagnosis not present

## 2022-09-19 DIAGNOSIS — R7303 Prediabetes: Secondary | ICD-10-CM | POA: Insufficient documentation

## 2022-09-19 DIAGNOSIS — E782 Mixed hyperlipidemia: Secondary | ICD-10-CM | POA: Diagnosis not present

## 2022-09-19 DIAGNOSIS — E785 Hyperlipidemia, unspecified: Secondary | ICD-10-CM | POA: Insufficient documentation

## 2022-09-19 NOTE — Assessment & Plan Note (Signed)
She is currently on vitamin D 50,000 units once weekly Follow-up in 2 months to recheck labs

## 2022-09-19 NOTE — Progress Notes (Signed)
Established Patient Office Visit  Subjective:  Patient ID: Heidi [name], female    DOB: 09/28/2000  Age: 22 y.o. MRN: [id number]  CC:  Chief Complaint  Patient presents with   Gynecologic Exam    Follow up for pap.    HPI Heidi [name] is a 22 y.o. female  has a past medical history of Anxiety, Depression, GERD (gastroesophageal reflux disease), Hyperlipidemia, Insomnia, Medical history non-contributory, Obesity, Prediabetes, and Vitamin D deficiency.  Patient presents for cervical Pap exam .  She denies any adverse reactions to current medications.    Past Medical History:  Diagnosis Date   Anxiety    Depression    GERD (gastroesophageal reflux disease)    Hyperlipidemia    Insomnia    Medical history non-contributory    Obesity    Prediabetes    Vitamin D deficiency     Past Surgical History:  Procedure Laterality Date   CESAREAN SECTION N/A 10/21/2019   Procedure: CESAREAN SECTION;  Surgeon: Truett Mainland, DO;  Location: [facility] LD [facility]S;  Service: Obstetrics;  Laterality: N/A;   NO PAST SURGERIES      Family History  Problem Relation Age of Onset   Diabetes Mother    Hypertension Mother    Stroke Neg Hx    Cancer Neg Hx     Social History   Socioeconomic History   Marital status: Single    Spouse name: Not on file   Number of children: 1   Years of education: Not on file   Highest education level: 12th grade  Occupational History   Not on file  Tobacco Use   Smoking status: Never   Smokeless tobacco: Never  Vaping Use   Vaping Use: Never used  Substance and Sexual Activity   Alcohol use: No   Drug use: No   Sexual activity: Yes    Partners: Female    Birth control/protection: Implant    Comment: Desires Nexplanon  Other Topics Concern   Not on file  Social History Narrative   Lives with her mother and uncle and her child.    Social Determinants of Health   Financial Resource Strain: Low Risk  (09/18/2022)   Overall Financial  Resource Strain (CARDIA)    Difficulty of Paying Living Expenses: Not hard at all  Recent Concern: Financial Resource Strain - High Risk (08/14/2022)   Overall Financial Resource Strain (CARDIA)    Difficulty of Paying Living Expenses: Very hard  Food Insecurity: No Food Insecurity (09/18/2022)   Hunger Vital Sign    Worried About Running Out of Food in the Last Year: Never true    Ran Out of Food in the Last Year: Never true  Transportation Needs: No Transportation Needs (09/18/2022)   PRAPARE - Hydrologist (Medical): No    Lack of Transportation (Non-Medical): No  Physical Activity: Sufficiently Active (09/18/2022)   Exercise Vital Sign    Days of Exercise per Week: 3 days    Minutes of Exercise per Session: 60 min  Stress: No Stress Concern Present (09/18/2022)   [place]    Feeling of Stress : Not at all  Social Connections: Moderately Isolated (09/18/2022)   Social Connection and Isolation Panel [NHANES]    Frequency of Communication with Friends and Family: More than three times a week    Frequency of Social Gatherings with Friends and Family: More than three times a week  Attends Religious Services: More than 4 times per year    Active Member of Clubs or Organizations: No    Attends Archivist Meetings: Never    Marital Status: Never married  Intimate Partner Violence: Not At Risk (06/23/2022)   Humiliation, Afraid, Rape, and Kick questionnaire    Fear of Current or Ex-Partner: No    Emotionally Abused: No    Physically Abused: No    Sexually Abused: No    Outpatient Medications Prior to Visit  Medication Sig Dispense Refill   hydrOXYzine (ATARAX) 25 MG tablet Take 1 tablet (25 mg total) by mouth every 8 (eight) hours as needed for anxiety. 75 tablet 1   melatonin 3 MG TABS tablet Take 1 tablet (3 mg total) by mouth at bedtime. 30 tablet 1   pantoprazole (PROTONIX) 20  MG tablet Take 1 tablet (20 mg total) by mouth daily. 30 tablet 0   sertraline (ZOLOFT) 100 MG tablet Take 1 tablet (100 mg total) by mouth daily. 30 tablet 1   traZODone (DESYREL) 100 MG tablet Take 1 tablet (100 mg total) by mouth at bedtime. 30 tablet 1   Vitamin D, Ergocalciferol, (DRISDOL) 1.25 MG (50000 UNIT) CAPS capsule Take 1 capsule (50,000 Units total) by mouth every 7 (seven) days. 8 capsule 0   No facility-administered medications prior to visit.    Allergies  Allergen Reactions   Strawberry Extract Nausea And Vomiting    Projectile vomiting    ROS Review of Systems  Constitutional: Negative.   Respiratory: Negative.    Cardiovascular: Negative.   Gastrointestinal: Negative.   Genitourinary: Negative.   Musculoskeletal: Negative.   Skin: Negative.   Neurological: Negative.   Psychiatric/Behavioral: Negative.        Objective:    Physical Exam Exam conducted with a chaperone present.  Constitutional:      General: She is not in acute distress.    Appearance: She is obese. She is not ill-appearing, toxic-appearing or diaphoretic.  Eyes:     General: No scleral icterus.       Right eye: No discharge.        Left eye: No discharge.     Extraocular Movements: Extraocular movements intact.  Cardiovascular:     Rate and Rhythm: Normal rate and regular rhythm.     Pulses: Normal pulses.     Heart sounds: Normal heart sounds. No murmur heard.    No friction rub. No gallop.  Pulmonary:     Effort: Pulmonary effort is normal. No respiratory distress.     Breath sounds: Normal breath sounds. No stridor. No wheezing, rhonchi or rales.  Chest:     Chest wall: No mass, lacerations, deformity, swelling, tenderness, crepitus or edema.  Breasts:    Tanner Score is 5.     Right: Normal. No swelling, bleeding, inverted nipple, mass, nipple discharge or skin change.     Left: Normal. No swelling, bleeding, inverted nipple, mass, nipple discharge, skin change or  tenderness.  Abdominal:     General: There is no distension.     Palpations: Abdomen is soft.     Tenderness: There is no abdominal tenderness. There is no guarding.     Hernia: There is no hernia in the left inguinal area or right inguinal area.  Genitourinary:    Exam position: Lithotomy position.     Pubic Area: No rash or pubic lice.      Tanner stage (genital): 5.     Labia:  Right: No rash, tenderness, lesion or injury.        Left: No rash, tenderness, lesion or injury.      Urethra: No prolapse, urethral pain, urethral swelling or urethral lesion.     Vagina: No signs of injury and foreign body. No vaginal discharge, erythema, tenderness, bleeding, lesions or prolapsed vaginal walls.     Cervix: No cervical motion tenderness, discharge, friability, lesion, erythema, cervical bleeding or eversion.     Uterus: Not deviated, not enlarged, not fixed, not tender and no uterine prolapse.      Adnexa:        Right: No mass, tenderness or fullness.         Left: No mass, tenderness or fullness.    Musculoskeletal:     Right lower leg: No edema.     Left lower leg: No edema.  Lymphadenopathy:     Upper Body:     Right upper body: No supraclavicular, axillary or pectoral adenopathy.     Left upper body: No supraclavicular, axillary or pectoral adenopathy.     Lower Body: No right inguinal adenopathy. No left inguinal adenopathy.  Skin:    General: Skin is warm and dry.     Coloration: Skin is not jaundiced.     Findings: No bruising or lesion.  Neurological:     Mental Status: She is alert and oriented to person, place, and time.     Cranial Nerves: No cranial nerve deficit.     Motor: No weakness.     Coordination: Coordination normal.     Gait: Gait normal.  Psychiatric:        Mood and Affect: Mood normal.        Behavior: Behavior normal.        Thought Content: Thought content normal.        Judgment: Judgment normal.     BP (!) 110/58   Pulse 74   Temp (!)  97.4 F (36.3 C)   Ht 5\' 1"  (1.549 m)   Wt 213 lb 3.2 oz (96.7 kg)   SpO2 97%   BMI 40.28 kg/m  Wt Readings from Last 3 Encounters:  09/19/22 213 lb 3.2 oz (96.7 kg)  08/15/22 209 lb 6.4 oz (95 kg)  12/03/21 192 lb (87.1 kg)    Lab Results  Component Value Date   TSH 0.701 08/15/2022   Lab Results  Component Value Date   WBC 9.4 06/23/2022   HGB 12.2 06/23/2022   HCT 36.3 06/23/2022   MCV 85.6 06/23/2022   PLT 190 06/23/2022   Lab Results  Component Value Date   NA 137 06/23/2022   K 4.0 06/23/2022   CO2 23 06/23/2022   GLUCOSE 109 (H) 06/23/2022   BUN 12 06/23/2022   CREATININE 0.56 06/23/2022   BILITOT 0.4 06/23/2022   ALKPHOS 56 06/23/2022   AST 15 06/23/2022   ALT 12 06/23/2022   PROT 8.0 06/23/2022   ALBUMIN 4.0 06/23/2022   CALCIUM 9.5 06/23/2022   ANIONGAP 5 06/23/2022   Lab Results  Component Value Date   CHOL 233 (H) 08/15/2022   Lab Results  Component Value Date   HDL 45 08/15/2022   Lab Results  Component Value Date   LDLCALC 182 (H) 08/15/2022   Lab Results  Component Value Date   TRIG 39 08/15/2022   Lab Results  Component Value Date   CHOLHDL 5.2 (H) 08/15/2022   Lab Results  Component Value Date   HGBA1C 6.0 (  H) 08/15/2022      Assessment & Plan:   Problem List Items Addressed This Visit       Other   Hyperlipidemia    Lab Results  Component Value Date   CHOL 233 (H) 08/15/2022   HDL 45 08/15/2022   LDLCALC 182 (H) 08/15/2022   TRIG 39 08/15/2022   CHOLHDL 5.2 (H) 08/15/2022   Avoid fatty fried foods, loose weight       Prediabetes    Lab Results  Component Value Date   HGBA1C 6.0 (H) 08/15/2022  Patient counseled on low-carb modified diet Encouraged to engage in regular moderate to vigorous exercise of at least 150 minutes weekly      Vitamin D deficiency    She is currently on vitamin D 50,000 units once weekly Follow-up in 2 months to recheck labs      Relevant Orders   Vitamin D, 25-hydroxy   Other  Visit Diagnoses     Screening for cervical cancer    -  Primary   Relevant Orders   Cytology - PAP([place])       No orders of the defined types were placed in this encounter.   Follow-up: Return in about 2 months (around 11/19/2022) for VItamin D defficiency .    Renee Rival, FNP

## 2022-09-19 NOTE — Assessment & Plan Note (Signed)
Lab Results  Component Value Date   HGBA1C 6.0 (H) 08/15/2022  Patient counseled on low-carb modified diet Encouraged to engage in regular moderate to vigorous exercise of at least 150 minutes weekly

## 2022-09-19 NOTE — Patient Instructions (Signed)
Please get your HPV vaccines at the pharmacy if you have not been vaccinated    1. Mixed hyperlipidemia   2. Prediabetes   3. Screening for cervical cancer  - Cytology - PAP([place])    It is important that you exercise regularly at least 30 minutes 5 times a week as tolerated  Think about what you will eat, plan ahead. Choose " clean, green, fresh or frozen" over canned, processed or packaged foods which are more sugary, salty and fatty. 70 to 75% of food eaten should be vegetables and fruit. Three meals at set times with snacks allowed between meals, but they must be fruit or vegetables. Aim to eat over a 12 hour period , example 7 am to 7 pm, and STOP after  your last meal of the day. Drink water,generally about 64 ounces per day, no other drink is as healthy. Fruit juice is best enjoyed in a healthy way, by EATING the fruit.  Thanks for choosing [facility] we consider it a privelige to serve you.

## 2022-09-19 NOTE — Assessment & Plan Note (Signed)
Lab Results  Component Value Date   CHOL 233 (H) 08/15/2022   HDL 45 08/15/2022   LDLCALC 182 (H) 08/15/2022   TRIG 39 08/15/2022   CHOLHDL 5.2 (H) 08/15/2022   Avoid fatty fried foods, loose weight

## 2022-09-20 LAB — CYTOLOGY - PAP
Chlamydia: NEGATIVE
Comment: NEGATIVE
Comment: NEGATIVE
Comment: NORMAL
Diagnosis: NEGATIVE
Neisseria Gonorrhea: NEGATIVE
Trichomonas: NEGATIVE

## 2022-09-21 NOTE — Progress Notes (Signed)
Pt has seen in my chart. Gh 

## 2022-10-16 ENCOUNTER — Ambulatory Visit (INDEPENDENT_AMBULATORY_CARE_PROVIDER_SITE_OTHER): Payer: Medicaid Other | Admitting: Mental Health

## 2022-10-16 DIAGNOSIS — F332 Major depressive disorder, recurrent severe without psychotic features: Secondary | ICD-10-CM | POA: Diagnosis not present

## 2022-10-16 DIAGNOSIS — F411 Generalized anxiety disorder: Secondary | ICD-10-CM

## 2022-10-16 NOTE — Progress Notes (Signed)
THERAPIST PROGRESS NOTE Virtual Visit via Video Note  I connected with Heidi [name] on 10/16/22 at  9:00 AM EDT by a video enabled telemedicine application and verified that I am speaking with the correct person using two identifiers.  Location: Patient: home address  Provider: office    I discussed the limitations of evaluation and management by telemedicine and the availability of in person appointments. The patient expressed understanding and agreed to proceed.  I discussed the assessment and treatment plan with the patient. The patient was provided an opportunity to ask questions and all were answered. The patient agreed with the plan and demonstrated an understanding of the instructions.   The patient was advised to call back or seek an in-person evaluation if the symptoms worsen or if the condition fails to improve as anticipated.  I provided 50 minutes of non-face-to-face time during this encounter.   Dorris Singh, [facility]  Session Time:  9:09am ( 50 minutes)  Participation Level: Active  Behavioral Response: CasualAlertDysphoric  Type of Therapy: Individual Therapy  Treatment Goals addressed: STG: Heidi [name] will increase management of moods (depression/anger) AEB development of x 3 effective emotional regulation and distress tolerance skills with increased ability to manage stressful events per self report within the next 90 days   ProgressTowards Goals: Progressing  Interventions: CBT and Supportive  Summary: Heidi [name] is a 22 y.o. female who presents with dx of major depression and generalized anxiety. Presents for follow up for engagement in outpatient therapy . Shares to be doing well and to be medication compliant. Shares for increase in depressive sxs and notes to have engaging in behaviors she is now regretful of. Reports to have started drinking and smoking and having sex and shares for a friend to be a negative influence on her. Shares to  feel like a "bad Christian." Notes plans to start to return to church. Reports for factor in increasing depression and negative thinking to be child's father and him judging her and stating she is a bad [place]. Shares thoughts of feeling like a bad parent and reports difficulty saying no with hx of people pleasing behaviors; poor boundaries. Shares for mood to have increased and thought process to have improved. Able to engage with therapist and explore behavioral changes to support in prosocial behaviors she would like to engage in; spending time with friend doing things with the children and seeing her during day time hours to decrease "partying" behaviors. Engaged in cognitive distortions worksheet and identifies with engaging in black and white thinking, mental filtering, disqualifying the postive and jumping to conclusions. Explored working to increase awareness of her boundaries and increasing awareness of presence of distorted thinking. Progress with goals; sxs improvement. Denies SI/HI    Suicidal/Homicidal: Nowithout intent/plan  Therapist Response: Therapist engaged Heidi [name] in tele-therapy session, completed check in and assessed for current location and confidentiality of session.  Assessed for current levl of functioning, sxs management and level of stressors. Assessed for current sxs of depression and anxiety and explored factors that have contributed to increase feelings of depression. Engaged in processing working to identify boundaries in sexual domain, emotional domains physical etc. Supported in processing thoughts and presence of maladaptive thinking patterns and ways in which it contributes to feelings and behaviors.Engaged in psycho-education on cognitive distortions and working to increase awareness thought patters have on level of functioning. Encouraged working to review and log thoughts and identify presence of distorted thoughts. Reviewed session, provided follow up. No safety  concerns reported.   Plan: Return again in x 4 weeks.  Diagnosis: MDD (major depressive disorder), recurrent severe, without psychosis  Generalized anxiety disorder  Collaboration of Care: Other None  Patient/Guardian was advised Release of Information must be obtained prior to any record release in order to collaborate their care with an outside provider. Patient/Guardian was advised if they have not already done so to contact the registration department to sign all necessary forms in order for [place] to release information regarding their care.   Consent: Patient/Guardian gives verbal consent for treatment and assignment of benefits for services provided during this visit. Patient/Guardian expressed understanding and agreed to proceed.   Heidi [name] [place], [facility] 10/16/2022

## 2022-10-23 ENCOUNTER — Other Ambulatory Visit (HOSPITAL_COMMUNITY): Payer: Self-pay | Admitting: Physician Assistant

## 2022-10-23 ENCOUNTER — Other Ambulatory Visit: Payer: Self-pay | Admitting: Nurse Practitioner

## 2022-10-23 DIAGNOSIS — K219 Gastro-esophageal reflux disease without esophagitis: Secondary | ICD-10-CM

## 2022-10-23 DIAGNOSIS — E559 Vitamin D deficiency, unspecified: Secondary | ICD-10-CM

## 2022-10-24 NOTE — Telephone Encounter (Signed)
Please advise if you would like pt to continue this med. KH

## 2022-11-17 ENCOUNTER — Other Ambulatory Visit: Payer: Medicaid Other

## 2022-11-17 DIAGNOSIS — E559 Vitamin D deficiency, unspecified: Secondary | ICD-10-CM

## 2022-11-18 LAB — HEPATITIS B CORE ANTIBODY, TOTAL: Hep B Core Total Ab: NEGATIVE

## 2022-11-18 LAB — HEPATITIS B SURFACE ANTIBODY,QUALITATIVE: Hep B Surface Ab, Qual: NONREACTIVE

## 2022-11-18 LAB — VITAMIN D 25 HYDROXY (VIT D DEFICIENCY, FRACTURES): Vit D, 25-Hydroxy: 21.3 ng/mL — ABNORMAL LOW (ref 30.0–100.0)

## 2022-11-21 ENCOUNTER — Ambulatory Visit (INDEPENDENT_AMBULATORY_CARE_PROVIDER_SITE_OTHER): Payer: Medicaid Other | Admitting: Mental Health

## 2022-11-21 ENCOUNTER — Telehealth (INDEPENDENT_AMBULATORY_CARE_PROVIDER_SITE_OTHER): Payer: Medicaid Other | Admitting: Nurse Practitioner

## 2022-11-21 DIAGNOSIS — F411 Generalized anxiety disorder: Secondary | ICD-10-CM

## 2022-11-21 DIAGNOSIS — F332 Major depressive disorder, recurrent severe without psychotic features: Secondary | ICD-10-CM

## 2022-11-21 DIAGNOSIS — E559 Vitamin D deficiency, unspecified: Secondary | ICD-10-CM

## 2022-11-21 NOTE — Patient Instructions (Addendum)
Foods rich in vitamin D include cod liver oil,Salmon,Swordfish,Tuna fish,Orange juice fortified with vitamin D,Dairy and plant milks fortified with vitamin D,Sardines,Beef liver .  Get in the morning sunshine  Please start taking vitamin D 1000 units daily.  Get the medication over-the-counter    It is important that you exercise regularly at least 30 minutes 5 times a week as tolerated  Think about what you will eat, plan ahead. Choose " clean, green, fresh or frozen" over canned, processed or packaged foods which are more sugary, salty and fatty. 70 to 75% of food eaten should be vegetables and fruit. Three meals at set times with snacks allowed between meals, but they must be fruit or vegetables. Aim to eat over a 12 hour period , example 7 am to 7 pm, and STOP after  your last meal of the day. Drink water,generally about 64 ounces per day, no other drink is as healthy. Fruit juice is best enjoyed in a healthy way, by EATING the fruit.  Thanks for choosing [facility] we consider it a privelige to serve you.

## 2022-11-21 NOTE — Progress Notes (Signed)
THERAPIST PROGRESS NOTE Virtual Visit via Video Note  I connected with Heidi [name] on 11/21/22 at  9:00 AM EDT by a video enabled telemedicine application and verified that I am speaking with the correct person using two identifiers.  Location: Patient: home address on file Provider: office    I discussed the limitations of evaluation and management by telemedicine and the availability of in person appointments. The patient expressed understanding and agreed to proceed.  I discussed the assessment and treatment plan with the patient. The patient was provided an opportunity to ask questions and all were answered. The patient agreed with the plan and demonstrated an understanding of the instructions.   The patient was advised to call back or seek an in-person evaluation if the symptoms worsen or if the condition fails to improve as anticipated.  I provided 43 minutes of non-face-to-face time during this encounter.   Dorris Singh, [facility]  Session Time: 9:02 am ( 43 minutes)  Participation Level: Active  Behavioral Response: CasualAlertWNL  Type of Therapy: Individual Therapy  Treatment Goals addressed: STG: [name] will increase management of moods (depression/anger) AEB development of x 3 effective emotional regulation and distress tolerance skills with increased ability to manage stressful events per self report within the next 90 days   ProgressTowards Goals: Progressing  Interventions: CBT and Supportive  Summary: Heidi [name] is a 22 y.o. female who presents with dx of major depression and generalized anxiety. Presents for follow up for engagement in outpatient therapy . Shares to be doing well and to be medication compliant. Shares for decrease in depressive sxs and notes to have increased engagement in behaviors in which she feels are inline with her values. Notes decrease in hanging out going to clubs, engaging in sex. Notes to engage with friend with  kid friendly activities in which she can bring her son. Shares difficulty in navigating relationship with sons father. Notes to have had a period in which she thought they would be able to be together again romantically but shares for this to have not worked out. Reports for him to call her names "slut, your not a good mom." Etc. Shares to have his thoughts of her in her mind increasing feelings of depression. Able to identify this to not be a healthy relationship for her and need to set firm boundaries. Shares difficulty in co-parenting. Reports low self-esteem and can seek validation from others which has lead to her re-engaging with him and others. Explored working to increase self esteem and self-validation and working to maintain firm boundaries. Shares plans to present to [name] with friend with children. Shares for depression to be rated a3/10 with 10 the worse and anxiety 3/10 as well. Working to make progress with goals and working to manage emotions. Denies SI/HI  Suicidal/Homicidal: Nowithout intent/plan  Therapist Response:  Therapist engaged [name] in tele-therapy session, completed check in and assessed for current location and confidentiality of session.  Assessed for current levl of functioning, sxs management and level of stressors. Assessed for current sxs of depression and anxiety and explored factors that have contributed to feelings of stress. Provided support and encouragement; validated feelings. Engaged [name] in exploring healthy vs. Unhealthy relationship dynamics and emotions largely dependent on others view of her. Encouraged ability to set healthy boundaries with others as needed as well as increasing sense of self and self-esteem. Encouraged working to improve positive self-talk and engaging in cognitive coping thoughts. Encouraged use of positive affirmations and engaging in  self-esteem building exercises. Reviewed session and provided follow up session. Assessed for safety.    Plan: Return again in  x 4 weeks.  Diagnosis: MDD (major depressive disorder), recurrent severe, without psychosis (HCC)  Generalized anxiety disorder  Collaboration of Care: Other None  Patient/Guardian was advised Release of Information must be obtained prior to any record release in order to collaborate their care with an outside provider. Patient/Guardian was advised if they have not already done so to contact the registration department to sign all necessary forms in order for [place] to release information regarding their care.   Consent: Patient/Guardian gives verbal consent for treatment and assignment of benefits for services provided during this visit. Patient/Guardian expressed understanding and agreed to proceed.   Stephan Minister [place], [facility] 11/21/2022

## 2022-11-21 NOTE — Assessment & Plan Note (Signed)
Last vitamin D Lab Results  Component Value Date   VD25OH 21.3 (L) 11/17/2022  Vitamin D level much improved from 8.9-21.3 Patient encouraged to start taking vitamin D 1000 units daily Increase intake of foods rich in vitamin D.

## 2022-11-21 NOTE — Progress Notes (Addendum)
Virtual Visit via Telephone Note  I connected with Heidi [name] @ on 11/21/22 at 0811am  by telephone and verified that I am speaking with the correct person using two identifiers. I spent 7 minutes on this tele health encounter.   Location: Patient: home Provider: office   I discussed the limitations, risks, security and privacy concerns of performing an evaluation and management service by telephone and the availability of in person appointments. I also discussed with the patient that there may be a patient responsible charge related to this service. The patient expressed understanding and agreed to proceed.  History of Present Illness: Heidi [name] with  has a past medical history of Anxiety, Depression, GERD (gastroesophageal reflux disease), Hyperlipidemia, Insomnia, Medical history non-contributory, Obesity, Prediabetes, and Vitamin D deficiency.  Patient presents for follow-up for vitamin D deficiency.   Was prescribed vitamin D 50,000 units once weekly for 8 weeks which she has completed.  Patient denies any adverse reactions to current medications    Observations/Objective:   Assessment and Plan: Vitamin D deficiency Last vitamin D Lab Results  Component Value Date   VD25OH 21.3 (L) 11/17/2022  Vitamin D level much improved from 8.9-21.3 Patient encouraged to start taking vitamin D 1000 units daily Increase intake of foods rich in vitamin D.   Follow Up Instructions:    I discussed the assessment and treatment plan with the patient. The patient was provided an opportunity to ask questions and all were answered. The patient agreed with the plan and demonstrated an understanding of the instructions.   The patient was advised to call back or seek an in-person evaluation if the symptoms worsen or if the condition fails to improve as anticipated.

## 2022-12-18 ENCOUNTER — Ambulatory Visit: Payer: Medicaid Other | Admitting: Family Medicine

## 2022-12-29 ENCOUNTER — Ambulatory Visit (INDEPENDENT_AMBULATORY_CARE_PROVIDER_SITE_OTHER): Payer: MEDICAID | Admitting: Mental Health

## 2022-12-29 DIAGNOSIS — F332 Major depressive disorder, recurrent severe without psychotic features: Secondary | ICD-10-CM

## 2022-12-29 DIAGNOSIS — F411 Generalized anxiety disorder: Secondary | ICD-10-CM

## 2022-12-29 NOTE — Progress Notes (Signed)
THERAPIST PROGRESS NOTE Virtual Visit via Video Note  I connected with Heidi [name] on 12/29/22 at  9:00 AM EDT by a video enabled telemedicine application and verified that I am speaking with the correct person using two identifiers.  Location: Patient: home address on file Provider: home office   I discussed the limitations of evaluation and management by telemedicine and the availability of in person appointments. The patient expressed understanding and agreed to proceed.  I discussed the assessment and treatment plan with the patient. The patient was provided an opportunity to ask questions and all were answered. The patient agreed with the plan and demonstrated an understanding of the instructions.   The patient was advised to call back or seek an in-person evaluation if the symptoms worsen or if the condition fails to improve as anticipated.  I provided 28 minutes of non-face-to-face time during this encounter.   Heidi [name], [facility]   Session Time: 9:02am   Participation Level: Active  Behavioral Response: CasualAlertAdequate  Type of Therapy: Individual Therapy  Treatment Goals addressed:  STG: [name] will increase management of moods (depression/anger) AEB development of x 3 effective emotional regulation and distress tolerance skills with increased ability to manage stressful events per self report within the next 90 days   STG: Heidi [name] will increase feelings of self-worth/esteem AEB engaging in self-esteem building exercises weekly within the next 90 days.   Active     OP Depression     LTG: Reduce frequency, intensity, and duration of depression symptoms so that daily functioning is improved (Progressing)     Start:  08/14/22    Expected End:  08/14/23         STG: Heidi [name] will increase management of moods (depression/anger) AEB development of x 3 effective emotional regulation and distress tolerance skills with increased ability to manage  stressful events per self report within the next 90 days.  (Progressing)     Start:  08/14/22    Expected End:  08/14/23      12/29/22- Heidi [name] has done well with ability to manage sxs of depression and anxiety. Shares use of natural supports, medication management and deep breathing as helpful coping skills. Reports increased ability to manage stressful episodes. Notes would like to continue to work in [employer] as well as increasing self-esteem.       STG: Heidi [name] will increase feelings of self-worth/esteem AEB engaging in self-esteem building exercises weekly within the next 90 days.  (Progressing)     Start:  12/29/22    Expected End:  08/14/23      12/29/22- new goal         ProgressTowards Goals: Progressing  Interventions: CBT and Supportive  Summary: Heidi [name] is a 22 y.o. female who presents with dx of major depression and generalized anxiety. Presents for follow up for engagement in outpatient therapy . Shares to be doing well and to be medication compliant. Shares for decrease in depressive sxs with use of coping skills to support. Shares periods of increased anxiety but notes "better controlled and managed." Shares use of breathing and calling and talking to a friend. Notes work related stress due to not receiving over time and not able to take PTO. Working increased hours; reduced sleep. Notes plans to obtain new position and to have job interview today. Shares increase with confidence and notes wearing x 2 piece bathing suit but in need of continuing to work on self-esteem with at times can make hurtful comments  towards her self. Shares better boundaries with son's father which has been a contributor to increase in functioning. Denies extreme lows; sxs stable at this time. Progress with goals; updated txt plan. No safety concerns reported.     Suicidal/Homicidal: Nowithout intent/plan  Therapist Response: Therapist engaged Heidi [name] in tele-therapy  session, completed check in and assessed for current location and confidentiality of session. Assessed for current levl of functioning, sxs management and level of stressors. Assessed for current sxs of depression and anxiety. Explored factors that have contributed to increase in functioning. Supported in processing boundaries with others and access in which can allow others to have to her of negative support and/or influence. Engaged in treatment updated and review of goals. Encouraged balance and engagement in healthy habits. No safety concerns reported.    Plan: Return again in  x 6 weeks.  Diagnosis: MDD (major depressive disorder), recurrent severe, without psychosis (HCC)  Generalized anxiety disorder  Collaboration of Care: Other None  Patient/Guardian was advised Release of Information must be obtained prior to any record release in order to collaborate their care with an outside provider. Patient/Guardian was advised if they have not already done so to contact the registration department to sign all necessary forms in order for [place] to release information regarding their care.   Consent: Patient/Guardian gives verbal consent for treatment and assignment of benefits for services provided during this visit. Patient/Guardian expressed understanding and agreed to proceed.   Heidi [name] [place], [facility] 12/29/2022

## 2023-02-14 ENCOUNTER — Ambulatory Visit (HOSPITAL_COMMUNITY): Payer: MEDICAID | Admitting: Mental Health

## 2023-02-14 DIAGNOSIS — F332 Major depressive disorder, recurrent severe without psychotic features: Secondary | ICD-10-CM | POA: Diagnosis not present

## 2023-02-14 DIAGNOSIS — F411 Generalized anxiety disorder: Secondary | ICD-10-CM | POA: Diagnosis not present

## 2023-02-14 NOTE — Progress Notes (Unsigned)
   THERAPIST PROGRESS NOTE Virtual Visit via Video Note  I connected with Heidi [name] on 02/14/23 at  4:00 PM EDT by a video enabled telemedicine application and verified that I am speaking with the correct person using two identifiers.  Location: Patient: home address on file Provider: home office   I discussed the limitations of evaluation and management by telemedicine and the availability of in person appointments. The patient expressed understanding and agreed to proceed.  I discussed the assessment and treatment plan with the patient. The patient was provided an opportunity to ask questions and all were answered. The patient agreed with the plan and demonstrated an understanding of the instructions.   The patient was advised to call back or seek an in-person evaluation if the symptoms worsen or if the condition fails to improve as anticipated.  I provided 35 minutes of non-face-to-face time during this encounter.   Dorris Singh, [facility]   Session Time: 4:00pm ( 35 minutes)  Participation Level: Active  Behavioral Response: CasualAlertEuthymic  Type of Therapy: Individual Therapy  Treatment Goals addressed: STG: Heidi [name] will increase management of moods (depression/anger) AEB development of x 3 effective emotional regulation and distress tolerance skills with increased ability to manage stressful events per self report within the next 90 days    STG: Heidi [name] will increase feelings of self-worth/esteem AEB engaging in self-esteem building exercises weekly within the next 90 days.   ProgressTowards Goals: Progressing  Interventions: Supportive  Summary:  Heidi [name] is a 22 y.o. female who presents with dx of major depression and generalized anxiety. Presents for follow up for engagement in outpatient therapy . Shares to be doing well and to be medication compliant. Shares for decrease in depressive sxs with use of coping skills to     Suicidal/Homicidal: Nowithout intent/plan  Therapist Response: Therapist engaged Heidi [name] in tele-therapy session, completed check in and assessed for current location and confidentiality of session. Assessed for current levl of functioning, sxs management and level of stressors. Assessed for current sxs of depression and anxiety. Explored factors   Plan: Return again in x6 weeks.  Diagnosis: MDD (major depressive disorder), recurrent severe, without psychosis (HCC)  Generalized anxiety disorder  Collaboration of Care: Other None  Patient/Guardian was advised Release of Information must be obtained prior to any record release in order to collaborate their care with an outside provider. Patient/Guardian was advised if they have not already done so to contact the registration department to sign all necessary forms in order for [place] to release information regarding their care.   Consent: Patient/Guardian gives verbal consent for treatment and assignment of benefits for services provided during this visit. Patient/Guardian expressed understanding and agreed to proceed.   Stephan Minister [place], [facility] 02/14/2023

## 2023-03-23 ENCOUNTER — Ambulatory Visit (INDEPENDENT_AMBULATORY_CARE_PROVIDER_SITE_OTHER): Payer: Self-pay | Admitting: Nurse Practitioner

## 2023-03-23 ENCOUNTER — Encounter: Payer: Self-pay | Admitting: Nurse Practitioner

## 2023-03-23 VITALS — BP 98/52 | HR 88 | Temp 97.2°F | Wt 228.2 lb

## 2023-03-23 DIAGNOSIS — N644 Mastodynia: Secondary | ICD-10-CM | POA: Insufficient documentation

## 2023-03-23 MED ORDER — CEPHALEXIN 500 MG PO CAPS: 500.0000 mg | ORAL_CAPSULE | Freq: Four times a day (QID) | ORAL | 0 refills | Status: AC

## 2023-03-23 NOTE — Patient Instructions (Signed)
1. Breast pain  - cephALEXin (KEFLEX) 500 MG capsule; Take 1 capsule (500 mg total) by mouth 4 (four) times daily for 7 days.  Dispense: 28 capsule; Refill: 0    It is important that you exercise regularly at least 30 minutes 5 times a week as tolerated  Think about what you will eat, plan ahead. Choose " clean, green, fresh or frozen" over canned, processed or packaged foods which are more sugary, salty and fatty. 70 to 75% of food eaten should be vegetables and fruit. Three meals at set times with snacks allowed between meals, but they must be fruit or vegetables. Aim to eat over a 12 hour period , example 7 am to 7 pm, and STOP after  your last meal of the day. Drink water,generally about 64 ounces per day, no other drink is as healthy. Fruit juice is best enjoyed in a healthy way, by EATING the fruit.  Thanks for choosing [facility] we consider it a privelige to serve you.

## 2023-03-23 NOTE — Assessment & Plan Note (Signed)
1. Breast pain - cephALEXin (KEFLEX) 500 MG capsule; Take 1 capsule (500 mg total) by mouth 4 (four) times daily for 7 days.  Dispense: 28 capsule; Refill: 0 - [place] LIMITED ULTRASOUND INCLUDING AXILLA RIGHT BREAST; Future   Patient told to get ultrasound done if she continues to have pain after completion of antibiotics. Encouraged to take Tylenol or ibuprofen as needed for breast soreness.

## 2023-03-23 NOTE — Progress Notes (Signed)
Acute Office Visit  Subjective:     Patient ID: Heidi [name], female    DOB: January 09, 2001, 22 y.o.   MRN: [id number]  Chief Complaint  Patient presents with   Breast Mass    Right side     HPI Heidi [name]  has a past medical history of Anxiety, Depression, GERD (gastroesophageal reflux disease), Hyperlipidemia, Insomnia, Medical history non-contributory, Obesity, Prediabetes, and Vitamin D deficiency. Patient is in today for  right breast soreness currently rated 7/10 since the past 2 weeks. She denies fever, chills,malaise,nipple discharge.    Review of Systems      Objective:    BP (!) 98/52   Pulse 88   Temp (!) 97.2 F (36.2 C)   Wt 228 lb 3.2 oz (103.5 kg)   SpO2 98%   BMI 43.12 kg/m    Physical Exam Vitals and nursing note reviewed. Exam conducted with a chaperone present.  Constitutional:      General: She is not in acute distress.    Appearance: Normal appearance. She is obese. She is not ill-appearing, toxic-appearing or diaphoretic.  HENT:     Mouth/Throat:     Mouth: Mucous membranes are moist.     Pharynx: Oropharynx is clear. No oropharyngeal exudate or posterior oropharyngeal erythema.  Eyes:     General: No scleral icterus.       Right eye: No discharge.        Left eye: No discharge.     Extraocular Movements: Extraocular movements intact.     Conjunctiva/sclera: Conjunctivae normal.  Cardiovascular:     Rate and Rhythm: Normal rate and regular rhythm.     Pulses: Normal pulses.     Heart sounds: Normal heart sounds. No murmur heard.    No friction rub. No gallop.  Pulmonary:     Effort: Pulmonary effort is normal. No respiratory distress.     Breath sounds: Normal breath sounds. No stridor. No wheezing, rhonchi or rales.  Chest:     Chest wall: No mass, lacerations, deformity, swelling, tenderness, crepitus or edema.  Breasts:    Tanner Score is 5.     Right: Tenderness present. No swelling, bleeding, inverted nipple, mass, nipple  discharge or skin change.     Left: No swelling, bleeding, inverted nipple, mass, nipple discharge, skin change or tenderness.     Comments: Tenderness on palpation between the right  upper outer quadrant and right lower outer quadrant Abdominal:     General: There is no distension.     Palpations: Abdomen is soft.     Tenderness: There is no abdominal tenderness. There is no right CVA tenderness, left CVA tenderness or guarding.  Musculoskeletal:        General: No swelling, tenderness, deformity or signs of injury.     Right lower leg: No edema.     Left lower leg: No edema.  Lymphadenopathy:     Upper Body:     Right upper body: No supraclavicular, axillary or pectoral adenopathy.     Left upper body: No supraclavicular, axillary or pectoral adenopathy.  Skin:    General: Skin is warm and dry.     Capillary Refill: Capillary refill takes less than 2 seconds.     Coloration: Skin is not jaundiced or pale.     Findings: No bruising, erythema or lesion.  Neurological:     Mental Status: She is alert and oriented to person, place, and time.     Motor: No weakness.  Coordination: Coordination normal.     Gait: Gait normal.  Psychiatric:        Mood and Affect: Mood normal.        Behavior: Behavior normal.        Thought Content: Thought content normal.        Judgment: Judgment normal.     No results found for any visits on 03/23/23.      Assessment & Plan:   Problem List Items Addressed This Visit       Other   Breast pain - Primary    1. Breast pain - cephALEXin (KEFLEX) 500 MG capsule; Take 1 capsule (500 mg total) by mouth 4 (four) times daily for 7 days.  Dispense: 28 capsule; Refill: 0 - [place] LIMITED ULTRASOUND INCLUDING AXILLA RIGHT BREAST; Future   Patient told to get ultrasound done if she continues to have pain after completion of antibiotics. Encouraged to take Tylenol or ibuprofen as needed for breast soreness.      Relevant Medications   cephALEXin  (KEFLEX) 500 MG capsule   Other Relevant Orders   [place] LIMITED ULTRASOUND INCLUDING AXILLA RIGHT BREAST    Meds ordered this encounter  Medications   cephALEXin (KEFLEX) 500 MG capsule    Sig: Take 1 capsule (500 mg total) by mouth 4 (four) times daily for 7 days.    Dispense:  28 capsule    Refill:  0    No follow-ups on file.  Donell Beers, FNP

## 2023-04-04 ENCOUNTER — Ambulatory Visit (HOSPITAL_COMMUNITY): Payer: MEDICAID | Admitting: Mental Health

## 2023-04-04 DIAGNOSIS — F411 Generalized anxiety disorder: Secondary | ICD-10-CM | POA: Diagnosis not present

## 2023-04-04 NOTE — Progress Notes (Signed)
THERAPIST PROGRESS NOTE Virtual Visit via Video Note  I connected with Heidi [name] on 04/04/2023 at  4:00 PM EDT by a video enabled telemedicine application and verified that I am speaking with the correct person using two identifiers.  Location: Patient: home address on file Provider: office   I discussed the limitations of evaluation and management by telemedicine and the availability of in person appointments. The patient expressed understanding and agreed to proceed.  I discussed the assessment and treatment plan with the patient. The patient was provided an opportunity to ask questions and all were answered. The patient agreed with the plan and demonstrated an understanding of the instructions.   The patient was advised to call back or seek an in-person evaluation if the symptoms worsen or if the condition fails to improve as anticipated.  I provided 48 minutes of non-face-to-face time during this encounter.   Dorris Singh, [facility]   Session Time: 4:07pm ( 48 minutes)  Participation Level: Active  Behavioral Response: CasualAlert/dysphoric  Type of Therapy: Individual Therapy  Treatment Goals addressed:  STG: [name] will increase management of moods (depression/anger) AEB development of x 3 effective emotional regulation and distress tolerance skills with increased ability to manage stressful events per self report within the next 90 days    STG: [name] will increase feelings of self-worth/esteem AEB engaging in self-esteem building exercises weekly within the next 90 days.   ProgressTowards Goals: Progressing  Interventions: Supportive  Summary: Heidi [name] is a 22 y.o. female who presents with dx of major depression and generalized anxiety. Presents for follow up for engagement in outpatient therapy . Shares for moods to have been more depressive and notes to have not restarted taking medications. Shares to not feel as If they were beneficial.  Shares events of presenting to [place] with son and for son's father to have presented as well on the trip. Shares was able to be cordial desire some disagreements with father. Shares concerns for current position with changes in routine and exploring going back to previous position. 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 Shares increase in low moods, negative thinking, reduced energy, anhedonia reported. Episodes of idle suicidal thoughts; denies plan intent or desire. Denies engaging in video games or other activities in which she enjoys, "Just work and home." Shares additional stressor of concern for friend who was hit by a drunk driver. PHQ: 19; GAD: 13; agrees to seek out re-engaging in medication management. Denies SI/HI. Increase sxs ; decompensation in mood. Ongoing work with goals needed.  Suicidal/Homicidal: Nowithout intent/plan  Therapist Response:  Therapist engaged Heidi [name] in tele-therapy session, completed check in and assessed for current location and confidentiality of session. Assessed for current levl of functioning, sxs management and level of stressors. Assessed for current sxs of depression and anxiety and explored current stressors. Supported in processing thoughts and contributors to decrease in mood. Completed PHQ and GAD and discussed scores. Assessed for safety. Provided psycho-education on importance of medication compliance as well as advocating for self and needs. Encouraged engagement in daily routine and explored supports presence. Reviewed importance of balanced thinking and ability to challenge negative thoughts. Reviewed session assessed for safety and provided follow up.   GAD:13 PHQ:19 Plan: Return again in  x5 weeks.  Diagnosis: Generalized anxiety disorder  Collaboration of Care: Other None  Patient/Guardian was advised Release of Information must be obtained prior  to any  record release in order to collaborate their care with an outside provider. Patient/Guardian was advised if they have not already done so to contact the registration department to sign all necessary forms in order for [place] to release information regarding their care.   Consent: Patient/Guardian gives verbal consent for treatment and assignment of benefits for services provided during this visit. Patient/Guardian expressed understanding and agreed to proceed.   Stephan Minister [place], [facility] 04/04/2023

## 2023-04-05 ENCOUNTER — Encounter (HOSPITAL_COMMUNITY): Payer: Self-pay

## 2023-04-05 ENCOUNTER — Encounter (HOSPITAL_COMMUNITY): Payer: No Payment, Other | Admitting: Physician Assistant

## 2023-04-12 ENCOUNTER — Encounter (HOSPITAL_COMMUNITY): Payer: Self-pay

## 2023-04-12 ENCOUNTER — Inpatient Hospital Stay (HOSPITAL_COMMUNITY): Payer: MEDICAID

## 2023-04-12 ENCOUNTER — Inpatient Hospital Stay (HOSPITAL_COMMUNITY)
Admission: AD | Admit: 2023-04-12 | Discharge: 2023-04-12 | Disposition: A | Discharge status: Home / Self Care | Payer: MEDICAID | Attending: Obstetrics & Gynecology | Admitting: Obstetrics & Gynecology

## 2023-04-12 DIAGNOSIS — O26891 Other specified pregnancy related conditions, first trimester: Secondary | ICD-10-CM | POA: Diagnosis not present

## 2023-04-12 DIAGNOSIS — Z3A01 Less than 8 weeks gestation of pregnancy: Secondary | ICD-10-CM | POA: Diagnosis not present

## 2023-04-12 DIAGNOSIS — N939 Abnormal uterine and vaginal bleeding, unspecified: Secondary | ICD-10-CM

## 2023-04-12 DIAGNOSIS — O209 Hemorrhage in early pregnancy, unspecified: Secondary | ICD-10-CM | POA: Diagnosis present

## 2023-04-12 LAB — COMPREHENSIVE METABOLIC PANEL
ALT: 12 U/L (ref 0–44)
AST: 15 U/L (ref 15–41)
Albumin: 3.3 g/dL — ABNORMAL LOW (ref 3.5–5.0)
Alkaline Phosphatase: 46 U/L (ref 38–126)
Anion gap: 8 (ref 5–15)
BUN: 7 mg/dL (ref 6–20)
CO2: 22 mmol/L (ref 22–32)
Calcium: 9.3 mg/dL (ref 8.9–10.3)
Chloride: 106 mmol/L (ref 98–111)
Creatinine, Ser: 0.6 mg/dL (ref 0.44–1.00)
GFR, Estimated: >60 mL/min (ref >60)
Glucose, Bld: 97 mg/dL (ref 70–99)
Potassium: 3.9 mmol/L (ref 3.5–5.1)
Sodium: 136 mmol/L (ref 135–145)
Total Bilirubin: 0.1 mg/dL — ABNORMAL LOW (ref 0.3–1.2)
Total Protein: 6.9 g/dL (ref 6.5–8.1)

## 2023-04-12 LAB — CBC
HCT: 35.1 % — ABNORMAL LOW (ref 36.0–46.0)
Hemoglobin: 11.7 g/dL — ABNORMAL LOW (ref 12.0–15.0)
MCH: 27.7 pg (ref 26.0–34.0)
MCHC: 33.3 g/dL (ref 30.0–36.0)
MCV: 83.2 fL (ref 80.0–100.0)
Platelets: 180 10*3/uL (ref 150–400)
RBC: 4.22 MIL/uL (ref 3.87–5.11)
RDW: 13.7 % (ref 11.5–15.5)
WBC: 8.5 10*3/uL (ref 4.0–10.5)
nRBC: 0 % (ref 0.0–0.2)

## 2023-04-12 LAB — URINALYSIS, ROUTINE W REFLEX MICROSCOPIC
Bilirubin Urine: NEGATIVE
Glucose, UA: NEGATIVE mg/dL
Ketones, ur: NEGATIVE mg/dL
Leukocytes,Ua: NEGATIVE
Nitrite: NEGATIVE
Protein, ur: NEGATIVE mg/dL
Specific Gravity, Urine: 1.023 (ref 1.005–1.030)
pH: 6 (ref 5.0–8.0)

## 2023-04-12 LAB — WET PREP, GENITAL
Clue Cells Wet Prep HPF POC: >=10 — AB
Sperm: NONE SEEN
Trich, Wet Prep: NONE SEEN
WBC, Wet Prep HPF POC: NONE SEEN — AB (ref <10)
Yeast Wet Prep HPF POC: NONE SEEN

## 2023-04-12 LAB — ABO/RH: ABO/RH(D): A POS

## 2023-04-12 LAB — OB RESULTS CONSOLE GC/CHLAMYDIA: Chlamydia: NEGATIVE

## 2023-04-12 LAB — POCT PREGNANCY, URINE: Preg Test, Ur: POSITIVE — AB

## 2023-04-12 LAB — HCG, QUANTITATIVE, PREGNANCY: hCG, Beta Chain, Quant, S: 27604 m[IU]/mL — ABNORMAL HIGH (ref <5)

## 2023-04-12 NOTE — MAU Note (Signed)
..  Heidi [name] is a 22 y.o. at [redacted]w[redacted]d here in MAU reporting: had pregnancy confirmed at [facility]. States she started having some dark brown/red bleeding and passed a very small clot yesterday. She has also been experiencing sharp RLQ pain that shoots down that started today and ongoing lower abdominal cramping   Pain score: 7 Vitals:   04/12/23 1449  BP: 128/65  Pulse: 85  Resp: 20  Temp: 98.2 F (36.8 C)  SpO2: 100%      Lab orders placed from triage:   UPT

## 2023-04-12 NOTE — MAU Provider Note (Signed)
Chief Complaint: Vaginal Bleeding  Provider at bedside 1657   SUBJECTIVE HPI: Heidi [name] is a 22 y.o. G2P1001 at [redacted]w[redacted]d by LMP who presents to maternity admissions reporting VB, pelvic pain.  Patient notes scant blood when she wiped last night. Having some cramping/shooting pains on her RLQ. She denies urinary symptoms, fever/chills, lightheadedness, dizziness. She has visited pregnancy network earlier and planning on abortion. Has resources on how to safely obtain this.  HPI  Past Medical History:  Diagnosis Date   Anxiety    Depression    GERD (gastroesophageal reflux disease)    Hyperlipidemia    Insomnia    Medical history non-contributory    Obesity    Prediabetes    Vitamin D deficiency    Past Surgical History:  Procedure Laterality Date   CESAREAN SECTION N/A 10/21/2019   Procedure: CESAREAN SECTION;  Surgeon: Levie Heritage, DO;  Location: [facility] LD [facility]S;  Service: Obstetrics;  Laterality: N/A;   NO PAST SURGERIES     Social History   Socioeconomic History   Marital status: Single    Spouse name: Not on file   Number of children: 1   Years of education: Not on file   Highest education level: 12th grade  Occupational History   Not on file  Tobacco Use   Smoking status: Never   Smokeless tobacco: Never  Vaping Use   Vaping status: Never Used  Substance and Sexual Activity   Alcohol use: No   Drug use: No   Sexual activity: Yes    Partners: Female    Birth control/protection: Implant    Comment: Desires Nexplanon  Other Topics Concern   Not on file  Social History Narrative   Lives with her mother and uncle and her child.    Social Determinants of Health   Financial Resource Strain: Low Risk  (09/18/2022)   Overall Financial Resource Strain (CARDIA)    Difficulty of Paying Living Expenses: Not hard at all  Recent Concern: Financial Resource Strain - High Risk (08/14/2022)   Overall Financial Resource Strain (CARDIA)    Difficulty of Paying Living  Expenses: Very hard  Food Insecurity: No Food Insecurity (09/19/2022)   Hunger Vital Sign    Worried About Running Out of Food in the Last Year: Never true    Ran Out of Food in the Last Year: Never true  Transportation Needs: No Transportation Needs (09/18/2022)   PRAPARE - Administrator, Civil Service (Medical): No    Lack of Transportation (Non-Medical): No  Physical Activity: Sufficiently Active (09/18/2022)   Exercise Vital Sign    Days of Exercise per Week: 3 days    Minutes of Exercise per Session: 60 min  Stress: No Stress Concern Present (09/18/2022)   [employer] of Occupational Health - Occupational Stress Questionnaire    Feeling of Stress : Not at all  Social Connections: Moderately Isolated (09/18/2022)   Social Connection and Isolation Panel [NHANES]    Frequency of Communication with Friends and Family: More than three times a week    Frequency of Social Gatherings with Friends and Family: More than three times a week    Attends Religious Services: More than 4 times per year    Active Member of Clubs or Organizations: No    Attends Banker Meetings: Never    Marital Status: Never married  Intimate Partner Violence: Unknown (07/09/2022)   Received from [facility], [facility]   HITS  Physically Hurt: Not on file    Insult or Talk Down To: Not on file    Threaten Physical Harm: Not on file    Scream or Curse: Not on file   No current facility-administered medications on file prior to encounter.   Current Outpatient Medications on File Prior to Encounter  Medication Sig Dispense Refill   hydrOXYzine (ATARAX) 25 MG tablet Take 1 tablet (25 mg total) by mouth every 8 (eight) hours as needed for anxiety. 75 tablet 1   melatonin 3 MG TABS tablet Take 1 tablet (3 mg total) by mouth at bedtime. (Patient not taking: Reported on 11/21/2022) 30 tablet 1   pantoprazole (PROTONIX) 20 MG tablet Take 1 tablet (20 mg total) by mouth daily. 30 tablet  0   sertraline (ZOLOFT) 100 MG tablet Take 1 tablet (100 mg total) by mouth daily. 30 tablet 1   traZODone (DESYREL) 100 MG tablet Take 1 tablet (100 mg total) by mouth at bedtime. 30 tablet 1   Vitamin D, Ergocalciferol, (DRISDOL) 1.25 MG (50000 UNIT) CAPS capsule TAKE 1 CAPSULE BY MOUTH EVERY 7 DAYS (Patient not taking: Reported on 11/21/2022) 8 capsule 0   Allergies  Allergen Reactions   Strawberry Extract Nausea And Vomiting    Projectile vomiting    ROS:  Pertinent positives/negatives listed above.  I have reviewed patient's Past Medical Hx, Surgical Hx, Family Hx, Social Hx, medications and allergies.   Physical Exam  Patient Vitals for the past 24 hrs:  BP Temp Temp src Pulse Resp SpO2 Height  04/12/23 1449 128/65 98.2 F (36.8 C) Oral 85 20 100 % 5\' 1"  (1.549 m)   Constitutional: Well-developed, well-nourished female in no acute distress Cardiovascular: normal rate Respiratory: normal effort GI: Abd soft, non-tender. Pos BS x 4 MS: Extremities nontender, no edema, normal ROM Neurologic: Alert and oriented x 4  LAB RESULTS Results for orders placed or performed during the hospital encounter of 04/12/23 (from the past 24 hour(s))  Urinalysis, Routine w reflex microscopic -Urine, Clean Catch     Status: Abnormal   Collection Time: 04/12/23  3:00 PM  Result Value Ref Range   Color, Urine YELLOW YELLOW   APPearance HAZY (A) CLEAR   Specific Gravity, Urine 1.023 1.005 - 1.030   pH 6.0 5.0 - 8.0   Glucose, UA NEGATIVE NEGATIVE mg/dL   Hgb urine dipstick MODERATE (A) NEGATIVE   Bilirubin Urine NEGATIVE NEGATIVE   Ketones, ur NEGATIVE NEGATIVE mg/dL   Protein, ur NEGATIVE NEGATIVE mg/dL   Nitrite NEGATIVE NEGATIVE   Leukocytes,Ua NEGATIVE NEGATIVE   RBC / HPF 0-5 0 - 5 RBC/hpf   WBC, UA 0-5 0 - 5 WBC/hpf   Bacteria, UA FEW (A) NONE SEEN   Squamous Epithelial / HPF 11-20 0 - 5 /HPF  Pregnancy, urine POC     Status: Abnormal   Collection Time: 04/12/23  3:01 PM  Result  Value Ref Range   Preg Test, Ur POSITIVE (A) NEGATIVE  Wet prep, genital     Status: Abnormal   Collection Time: 04/12/23  3:09 PM  Result Value Ref Range   Yeast Wet Prep HPF POC NONE SEEN NONE SEEN   Trich, Wet Prep NONE SEEN NONE SEEN   Clue Cells Wet Prep HPF POC >=10 (A) NONE SEEN   WBC, Wet Prep HPF POC NONE SEEN (A) <10   Sperm NONE SEEN   ABO/Rh     Status: None   Collection Time: 04/12/23  3:25 PM  Result  Value Ref Range   ABO/RH(D) A POS    No rh immune globuloin      NOT A RH IMMUNE GLOBULIN CANDIDATE, PT RH POSITIVE Performed at [facility], [address], [place], [place] [address]   CBC     Status: Abnormal   Collection Time: 04/12/23  3:26 PM  Result Value Ref Range   WBC 8.5 4.0 - 10.5 K/uL   RBC 4.22 3.87 - 5.11 MIL/uL   Hemoglobin 11.7 (L) 12.0 - 15.0 g/dL   HCT 56.2 (L) 13.0 - 86.5 %   MCV 83.2 80.0 - 100.0 fL   MCH 27.7 26.0 - 34.0 pg   MCHC 33.3 30.0 - 36.0 g/dL   RDW 78.4 69.6 - 29.5 %   Platelets 180 150 - 400 K/uL   nRBC 0.0 0.0 - 0.2 %  Comprehensive metabolic panel     Status: Abnormal   Collection Time: 04/12/23  3:26 PM  Result Value Ref Range   Sodium 136 135 - 145 mmol/L   Potassium 3.9 3.5 - 5.1 mmol/L   Chloride 106 98 - 111 mmol/L   CO2 22 22 - 32 mmol/L   Glucose, Bld 97 70 - 99 mg/dL   BUN 7 6 - 20 mg/dL   Creatinine, Ser 2.84 0.44 - 1.00 mg/dL   Calcium 9.3 8.9 - 13.2 mg/dL   Total Protein 6.9 6.5 - 8.1 g/dL   Albumin 3.3 (L) 3.5 - 5.0 g/dL   AST 15 15 - 41 U/L   ALT 12 0 - 44 U/L   Alkaline Phosphatase 46 38 - 126 U/L   Total Bilirubin 0.1 (L) 0.3 - 1.2 mg/dL   GFR, Estimated >44 >01 mL/min   Anion gap 8 5 - 15  hCG, quantitative, pregnancy     Status: Abnormal   Collection Time: 04/12/23  3:26 PM  Result Value Ref Range   hCG, Beta Chain, Quant, S 27,604 (H) <5 mIU/mL    --/--/A POS (10/17 1525)  IMAGING US OB LESS THAN 14 WEEKS WITH OB TRANSVAGINAL  Result Date: 04/12/2023 CLINICAL DATA:  Vaginal bleeding.  EXAM: OBSTETRIC <14 WK ULTRASOUND TECHNIQUE: Transabdominal ultrasound was performed for evaluation of the gestation as well as the maternal uterus and adnexal regions. COMPARISON:  None Available. FINDINGS: Intrauterine gestational sac: Single Yolk sac:  Visualized. Embryo:  Visualized. Cardiac Activity: Visualized. Heart Rate: 98 bpm CRL:   4.2 mm   6 w 1 d                  [place] EDC: 12/05/2023 Subchorionic hemorrhage:  None visualized. Maternal uterus/adnexae: Normal ovaries bilaterally. No adnexal mass. No free fluid. IMPRESSION: Single live intrauterine gestation.  No subchorionic hemorrhage. Electronically Signed   By: Annia Belt M.D.   On: 04/12/2023 16:56    MAU Management/MDM: Orders Placed This Encounter  Procedures   Wet prep, genital   US OB LESS THAN 14 WEEKS WITH OB TRANSVAGINAL   Urinalysis, Routine w reflex microscopic -Urine, Clean Catch   CBC   Comprehensive metabolic panel   hCG, quantitative, pregnancy   Pregnancy, urine POC   ABO/Rh   Discharge patient    No orders of the defined types were placed in this encounter.   Patient presents with RLQ pain and scant VB in early pregnancy. Need to rule-out ectopic pregnancy at this time and assess for possible miscarriage, vaginitis, UTI.  Patient has no signs of UTI, vaginitis, ectopic. IUP of approximately 6 weeks  confirmed. Discussed RLQ pain could be 2/2 gas, constipation, MSK but does not indicate an ectopic. At this time, there are also not signs of a miscarriage or subchorionic hemorrhage.  Patient plans termination for this pregnancy. Confirmed that she has resources to have this done safely. No pain at time of discharge.  ASSESSMENT 1. Vaginal bleeding   2. [redacted] weeks gestation of pregnancy     PLAN Discharge home with strict return precautions. Allergies as of 04/12/2023       Reactions   Strawberry Extract Nausea And Vomiting   Projectile vomiting        Medication List     TAKE these medications     hydrOXYzine 25 MG tablet Commonly known as: ATARAX Take 1 tablet (25 mg total) by mouth every 8 (eight) hours as needed for anxiety.   melatonin 3 MG Tabs tablet Take 1 tablet (3 mg total) by mouth at bedtime.   pantoprazole 20 MG tablet Commonly known as: PROTONIX Take 1 tablet (20 mg total) by mouth daily.   sertraline 100 MG tablet Commonly known as: ZOLOFT Take 1 tablet (100 mg total) by mouth daily.   traZODone 100 MG tablet Commonly known as: DESYREL Take 1 tablet (100 mg total) by mouth at bedtime.   Vitamin D (Ergocalciferol) 1.25 MG (50000 UNIT) Caps capsule Commonly known as: DRISDOL TAKE 1 CAPSULE BY MOUTH EVERY 7 DAYS         Wylene Simmer, MD OB Fellow 04/12/2023  5:27 PM

## 2023-04-14 NOTE — Plan of Care (Signed)
[facility] Tonsillectomy/Adenoidectomy, Postoperative PEDS care plan entered in error.

## 2023-04-16 LAB — GC/CHLAMYDIA PROBE AMP (~~LOC~~) NOT AT ARMC
Chlamydia: NEGATIVE
Comment: NEGATIVE
Comment: NORMAL
Neisseria Gonorrhea: NEGATIVE

## 2023-04-19 ENCOUNTER — Other Ambulatory Visit: Payer: MEDICAID

## 2023-04-26 ENCOUNTER — Other Ambulatory Visit: Payer: MEDICAID

## 2023-05-02 ENCOUNTER — Ambulatory Visit
Admission: RE | Admit: 2023-05-02 | Discharge: 2023-05-02 | Disposition: A | Discharge status: Home / Self Care | Payer: MEDICAID | Source: Ambulatory Visit | Attending: Nurse Practitioner | Admitting: Nurse Practitioner

## 2023-05-02 DIAGNOSIS — N644 Mastodynia: Secondary | ICD-10-CM

## 2023-05-22 ENCOUNTER — Encounter (HOSPITAL_COMMUNITY): Payer: Self-pay | Admitting: Obstetrics & Gynecology

## 2023-05-22 ENCOUNTER — Inpatient Hospital Stay (HOSPITAL_COMMUNITY)
Admission: AD | Admit: 2023-05-22 | Discharge: 2023-05-22 | Disposition: A | Discharge status: Home / Self Care | Payer: Self-pay | Attending: Obstetrics & Gynecology | Admitting: Obstetrics & Gynecology

## 2023-05-22 ENCOUNTER — Inpatient Hospital Stay (HOSPITAL_COMMUNITY): Payer: Self-pay

## 2023-05-22 ENCOUNTER — Other Ambulatory Visit: Payer: Self-pay

## 2023-05-22 DIAGNOSIS — R102 Pelvic and perineal pain: Secondary | ICD-10-CM

## 2023-05-22 DIAGNOSIS — O209 Hemorrhage in early pregnancy, unspecified: Secondary | ICD-10-CM | POA: Insufficient documentation

## 2023-05-22 DIAGNOSIS — O26891 Other specified pregnancy related conditions, first trimester: Secondary | ICD-10-CM | POA: Insufficient documentation

## 2023-05-22 DIAGNOSIS — Z3A12 12 weeks gestation of pregnancy: Secondary | ICD-10-CM | POA: Insufficient documentation

## 2023-05-22 LAB — URINALYSIS, ROUTINE W REFLEX MICROSCOPIC
Bilirubin Urine: NEGATIVE
Glucose, UA: NEGATIVE mg/dL
Ketones, ur: 20 mg/dL — AB
Leukocytes,Ua: NEGATIVE
Nitrite: NEGATIVE
Protein, ur: NEGATIVE mg/dL
Specific Gravity, Urine: 1.026 (ref 1.005–1.030)
pH: 5 (ref 5.0–8.0)

## 2023-05-22 NOTE — MAU Note (Signed)
Pt called in MAU lobby and larger lobby for u/s and pt not present. Admission person states they think she walked out

## 2023-05-22 NOTE — MAU Note (Signed)
Pt is not in lobby

## 2023-05-22 NOTE — MAU Provider Note (Addendum)
History     CSN: [id number]  Arrival date and time: 05/22/23 1712   Event Date/Time   First Provider Initiated Contact with Patient 05/22/23 1825      Chief Complaint  Patient presents with   Vaginal Bleeding   Pelvic Pain   HPI Ms. Heidi [name] is a 22 y.o. year old G50P1001 female at [redacted]w[redacted]d weeks gestation who presents to MAU reporting she may be having a miscarriage. She reports pelvic pain and VB that began today. She reports the VB has clots with wiping; she's wearing a pad. She describes the pain as a constant cramp in the pelvic region; rated 8/10. She denies any recent SI. She has not received PNC at this time, but plans to establish care with [facility]    OB History     Gravida  2   Para  1   Term  1   Preterm      AB      Living  1      SAB      IAB      Ectopic      Multiple  0   Live Births  1           Past Medical History:  Diagnosis Date   Anxiety    Depression    GERD (gastroesophageal reflux disease)    Hyperlipidemia    Insomnia    Medical history non-contributory    Obesity    Prediabetes    Vitamin D deficiency     Past Surgical History:  Procedure Laterality Date   CESAREAN SECTION N/A 10/21/2019   Procedure: CESAREAN SECTION;  Surgeon: Levie Heritage, DO;  Location: [facility] LD ORS;  Service: Obstetrics;  Laterality: N/A;   NO PAST SURGERIES      Family History  Problem Relation Age of Onset   Diabetes Mother    Hypertension Mother    Stroke Neg Hx    Cancer Neg Hx     Social History   Tobacco Use   Smoking status: Never   Smokeless tobacco: Never  Vaping Use   Vaping status: Never Used  Substance Use Topics   Alcohol use: No   Drug use: No    Allergies:  Allergies  Allergen Reactions   Strawberry Extract Nausea And Vomiting    Projectile vomiting    Medications Prior to Admission  Medication Sig Dispense Refill Last Dose   hydrOXYzine (ATARAX) 25 MG tablet Take 1 tablet (25 mg total) by mouth every 8  (eight) hours as needed for anxiety. 75 tablet 1    melatonin 3 MG TABS tablet Take 1 tablet (3 mg total) by mouth at bedtime. (Patient not taking: Reported on 11/21/2022) 30 tablet 1    pantoprazole (PROTONIX) 20 MG tablet Take 1 tablet (20 mg total) by mouth daily. 30 tablet 0    sertraline (ZOLOFT) 100 MG tablet Take 1 tablet (100 mg total) by mouth daily. 30 tablet 1    traZODone (DESYREL) 100 MG tablet Take 1 tablet (100 mg total) by mouth at bedtime. 30 tablet 1    Vitamin D, Ergocalciferol, (DRISDOL) 1.25 MG (50000 UNIT) CAPS capsule TAKE 1 CAPSULE BY MOUTH EVERY 7 DAYS (Patient not taking: Reported on 11/21/2022) 8 capsule 0     Review of Systems  Constitutional: Negative.   HENT: Negative.    Eyes: Negative.   Respiratory: Negative.    Cardiovascular: Negative.   Gastrointestinal: Negative.   Endocrine: Negative.  Genitourinary:  Positive for vaginal bleeding.  Musculoskeletal: Negative.   Skin: Negative.   Allergic/Immunologic: Negative.   Neurological: Negative.   Hematological: Negative.   Psychiatric/Behavioral: Negative.     Physical Exam   Blood pressure (!) 106/59, pulse 74, temperature 98.3 F (36.8 C), temperature source Oral, resp. rate 19, height 5\' 2"  (1.575 m), weight 104.7 kg, last menstrual period 02/19/2023, SpO2 96%.  Physical Exam Vitals and nursing note reviewed. Exam conducted with a chaperone present.  Constitutional:      Appearance: Normal appearance. She is obese.  Cardiovascular:     Rate and Rhythm: Normal rate.  Pulmonary:     Effort: Pulmonary effort is normal.  Musculoskeletal:        General: Normal range of motion.  Skin:    General: Skin is warm and dry.  Neurological:     Mental Status: She is alert and oriented to person, place, and time.  Psychiatric:        Mood and Affect: Mood normal.        Behavior: Behavior normal.        Thought Content: Thought content normal.        Judgment: Judgment normal.   FHTs by doppler:  unable to hear with doppler  MAU Course  Procedures Patient informed that the ultrasound is considered a limited OB ultrasound and is not intended to be a complete ultrasound exam.  Patient also informed that the ultrasound is not being completed with the intent of assessing for fetal or placental anomalies or any pelvic abnormalities.  Explained that the purpose of today's ultrasound is to assess for viability.  ?Baby was found on ultrasound, but too small to be certain. Will send for formal ultrasound. Patient acknowledges the purpose of the exam and the limitations of the study.    MDM CBC HCG OB<14 wks TVUS  Results for orders placed or performed during the hospital encounter of 05/22/23 (from the past 24 hour(s))  Urinalysis, Routine w reflex microscopic -Urine, Clean Catch     Status: Abnormal   Collection Time: 05/22/23  6:33 PM  Result Value Ref Range   Color, Urine YELLOW YELLOW   APPearance HAZY (A) CLEAR   Specific Gravity, Urine 1.026 1.005 - 1.030   pH 5.0 5.0 - 8.0   Glucose, UA NEGATIVE NEGATIVE mg/dL   Hgb urine dipstick LARGE (A) NEGATIVE   Bilirubin Urine NEGATIVE NEGATIVE   Ketones, ur 20 (A) NEGATIVE mg/dL   Protein, ur NEGATIVE NEGATIVE mg/dL   Nitrite NEGATIVE NEGATIVE   Leukocytes,Ua NEGATIVE NEGATIVE   RBC / HPF 0-5 0 - 5 RBC/hpf   WBC, UA 6-10 0 - 5 WBC/hpf   Bacteria, UA RARE (A) NONE SEEN   Squamous Epithelial / HPF 0-5 0 - 5 /HPF   Mucus PRESENT     No results found.  Report given to and care assumed by Wynelle Bourgeois, CNM @ [address] CNM 05/22/2023, 6:39 PM   Assumed care Labs and [place] ordered Patient eloped prior to having any of these done.    Assessment and Plan  A:  Pregnancy at [redacted]w[redacted]d by LMP       Pelvic pain       Vaginal bleeding  P:  Patient eloped AMA  Aviva Signs, CNM

## 2023-05-22 NOTE — Progress Notes (Signed)
Heidi [name], CNM into see pt and perform bedside ultrasound.  Unable to get a clear view of embryo, pt will go to radiology for "formal" ultrasound.

## 2023-05-22 NOTE — MAU Note (Signed)
PT is not in lobby and Wynelle Bourgeois CNM aware pt has gone

## 2023-05-22 NOTE — MAU Note (Signed)
Heidi [name] is a 22 y.o. at [redacted]w[redacted]d here in MAU reporting: she's being seen for possible miscarriage.  Reports she is having pelvic pain and VB that began today.  States she had VB with clots when wiping, now wearing a pad.  Pt showed RN a picture of VB, bleeding with small clot noted on toilet tissue.  Also reports having constant cramps in pelvic region.  Denies recent intercourse. LMP: NA Onset of complaint: today Pain score: 8 Vitals:   05/22/23 1753  BP: (!) 106/59  Pulse: 74  Resp: 19  Temp: 98.3 F (36.8 C)  SpO2: 96%     [id number]  Lab orders placed from triage:

## 2023-05-28 ENCOUNTER — Ambulatory Visit (INDEPENDENT_AMBULATORY_CARE_PROVIDER_SITE_OTHER): Payer: Self-pay | Admitting: Mental Health

## 2023-05-28 DIAGNOSIS — F411 Generalized anxiety disorder: Secondary | ICD-10-CM

## 2023-05-28 DIAGNOSIS — F332 Major depressive disorder, recurrent severe without psychotic features: Secondary | ICD-10-CM

## 2023-05-28 NOTE — Progress Notes (Unsigned)
   THERAPIST PROGRESS NOTE Virtual Visit via Video Note  I connected with Heidi [name] on 05/28/23 at  3:00 PM EST by a video enabled telemedicine application and verified that I am speaking with the correct person using two identifiers.  Location: Patient: home address on file Provider: home office   I discussed the limitations of evaluation and management by telemedicine and the availability of in person appointments. The patient expressed understanding and agreed to proceed.  I discussed the assessment and treatment plan with the patient. The patient was provided an opportunity to ask questions and all were answered. The patient agreed with the plan and demonstrated an understanding of the instructions.   The patient was advised to call back or seek an in-person evaluation if the symptoms worsen or if the condition fails to improve as anticipated.  I provided 47 minutes of non-face-to-face time during this encounter.   Dorris Singh, [facility]   Session Time: 3:07 pm ( 47 minutes)  Participation Level: Active  Behavioral Response: CasualAlertDysphoric  Type of Therapy: Individual Therapy  Treatment Goals addressed: STG: Heidi [name] will increase management of moods (depression/anger) AEB development of x 3 effective emotional regulation and distress tolerance skills with increased ability to manage stressful events per self report within the next 90 days    STG: Heidi [name] will increase feelings of self-worth/esteem AEB engaging in self-esteem building exercises weekly within the next 90 days  ProgressTowards Goals: Progressing  Interventions: CBT and Supportive  Summary: Heidi [name] is a 22 y.o. female who presents with dx of major depression and generalized anxiety. Presents for follow up for engagement in outpatient therapy . Shares for moods to have been more depressive and notes to have not restarted taking medications. Shares to   Suicidal/Homicidal:  Nowithout intent/plan  Therapist Response: Therapist engaged Heidi [name] in tele-therapy session, completed check in and assessed for current location and confidentiality of session. Assessed for current levl of functioning, sxs management and level of stressors. Assessed for current sxs of   Plan: Return again in  x 2 weeks.  Diagnosis: MDD (major depressive disorder), recurrent severe, without psychosis (HCC)  Generalized anxiety disorder  Collaboration of Care: Other None  Patient/Guardian was advised Release of Information must be obtained prior to any record release in order to collaborate their care with an outside provider. Patient/Guardian was advised if they have not already done so to contact the registration department to sign all necessary forms in order for [place] to release information regarding their care.   Consent: Patient/Guardian gives verbal consent for treatment and assignment of benefits for services provided during this visit. Patient/Guardian expressed understanding and agreed to proceed.   Stephan Minister [place], [facility] 05/28/2023

## 2023-06-11 ENCOUNTER — Ambulatory Visit (HOSPITAL_COMMUNITY): Payer: No Payment, Other | Admitting: Mental Health

## 2023-06-11 ENCOUNTER — Telehealth (HOSPITAL_COMMUNITY): Payer: Self-pay | Admitting: Mental Health

## 2023-06-11 ENCOUNTER — Encounter (HOSPITAL_COMMUNITY): Payer: Self-pay

## 2023-06-11 NOTE — Telephone Encounter (Signed)
Therapist sent link for tele-therapy session x 2 with no response after 10 minutes. Contact pt via telephone who reported to be working at this time and unable to hold session. Therapist reminded pt of appointment 1/24 @ 10am virtually in which she reported would be able to make. NS

## 2023-07-20 ENCOUNTER — Ambulatory Visit (INDEPENDENT_AMBULATORY_CARE_PROVIDER_SITE_OTHER): Payer: No Payment, Other | Admitting: Mental Health

## 2023-07-20 DIAGNOSIS — F332 Major depressive disorder, recurrent severe without psychotic features: Secondary | ICD-10-CM | POA: Diagnosis not present

## 2023-07-20 DIAGNOSIS — F411 Generalized anxiety disorder: Secondary | ICD-10-CM
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
## 2023-08-01 IMAGING — DX DG KNEE COMPLETE 4+V*R*
4 series · 4 of 4 positions shown · non-contrast
Comparison: None.

CLINICAL DATA: RIGHT knee pain

EXAM:
RIGHT KNEE - COMPLETE 4+ VIEW

[knee ap]
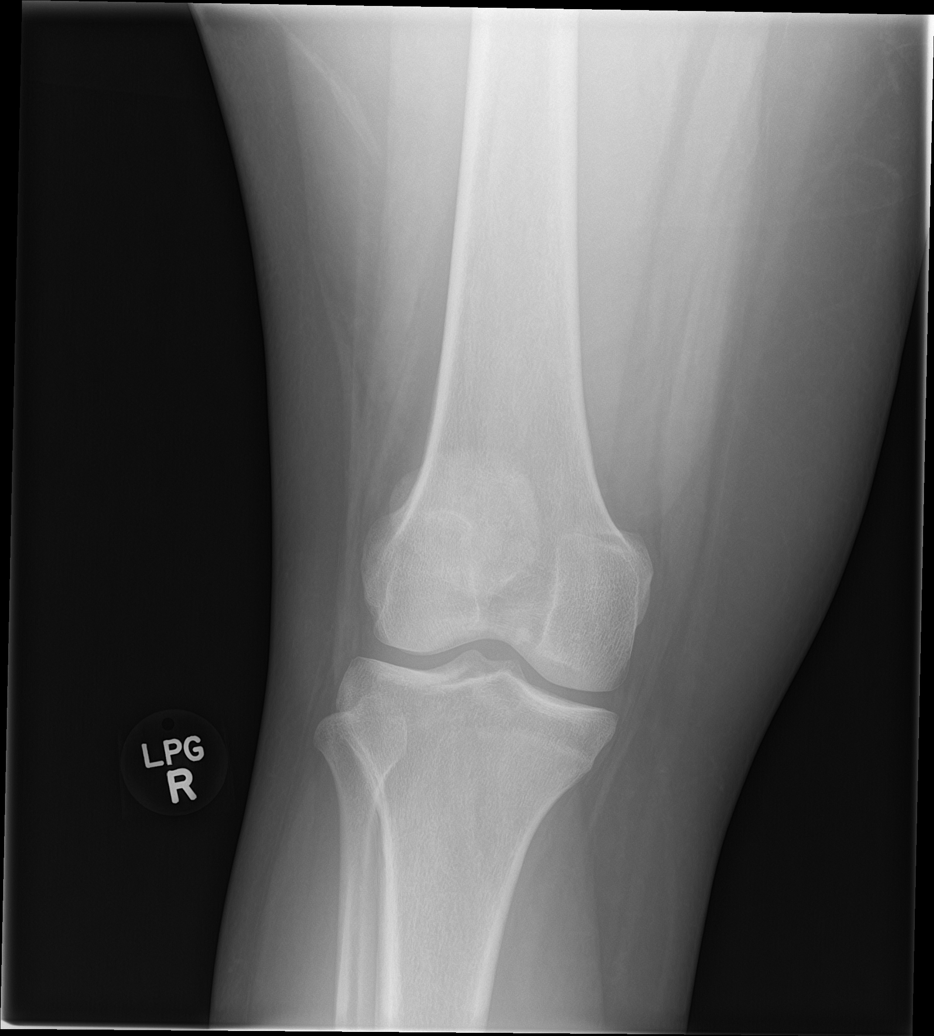

[knee obl (1 of 2)]
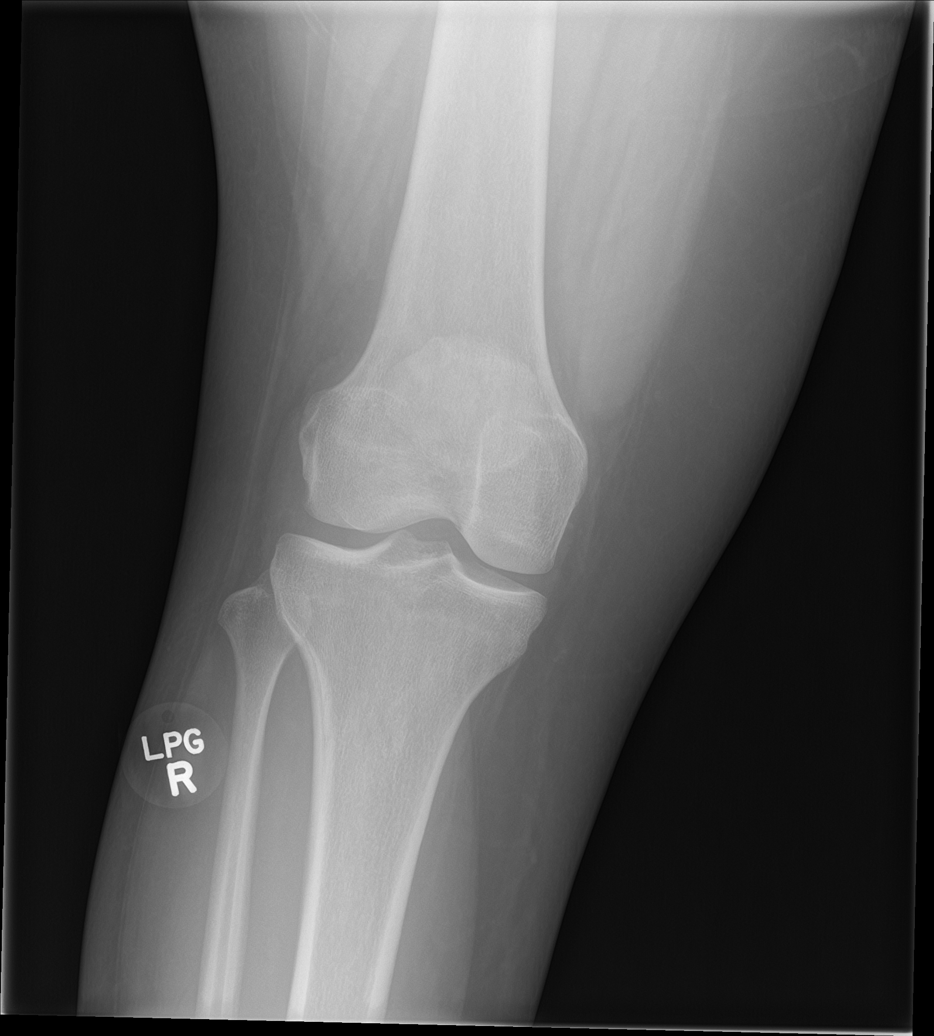

[knee obl (2 of 2)]
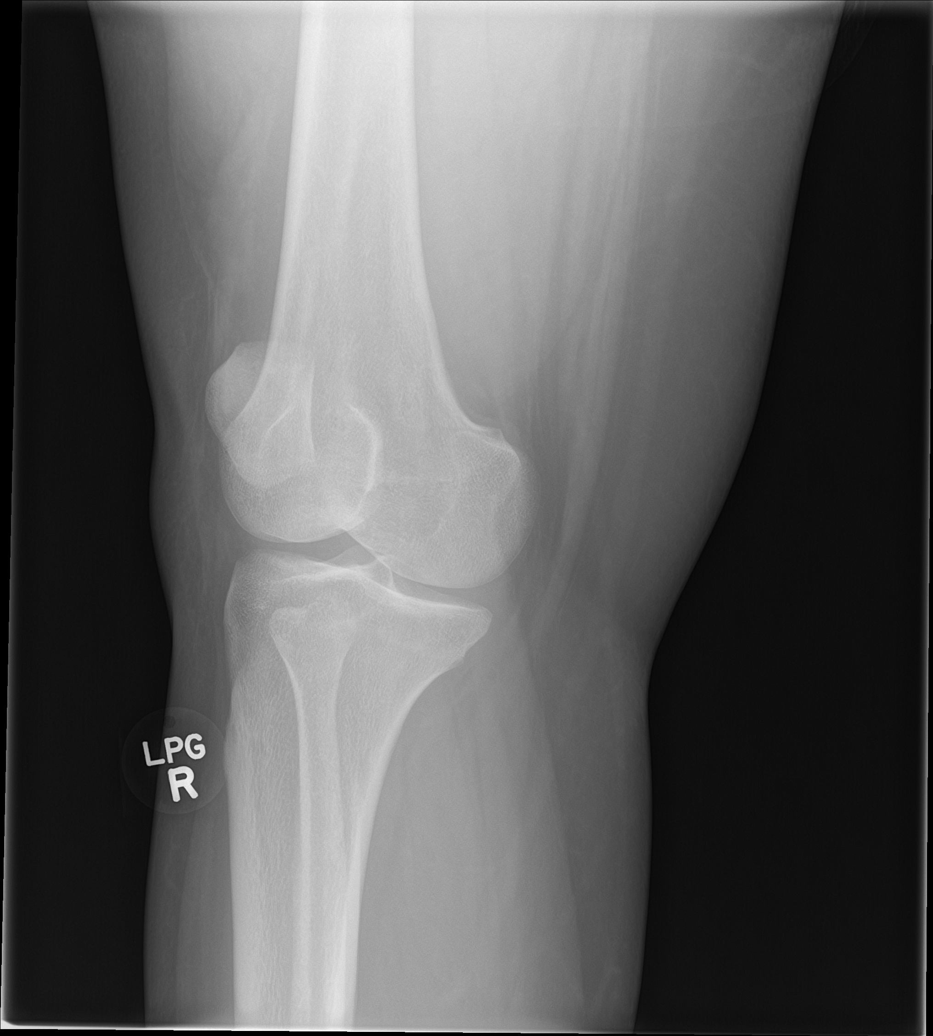

[knee lat]
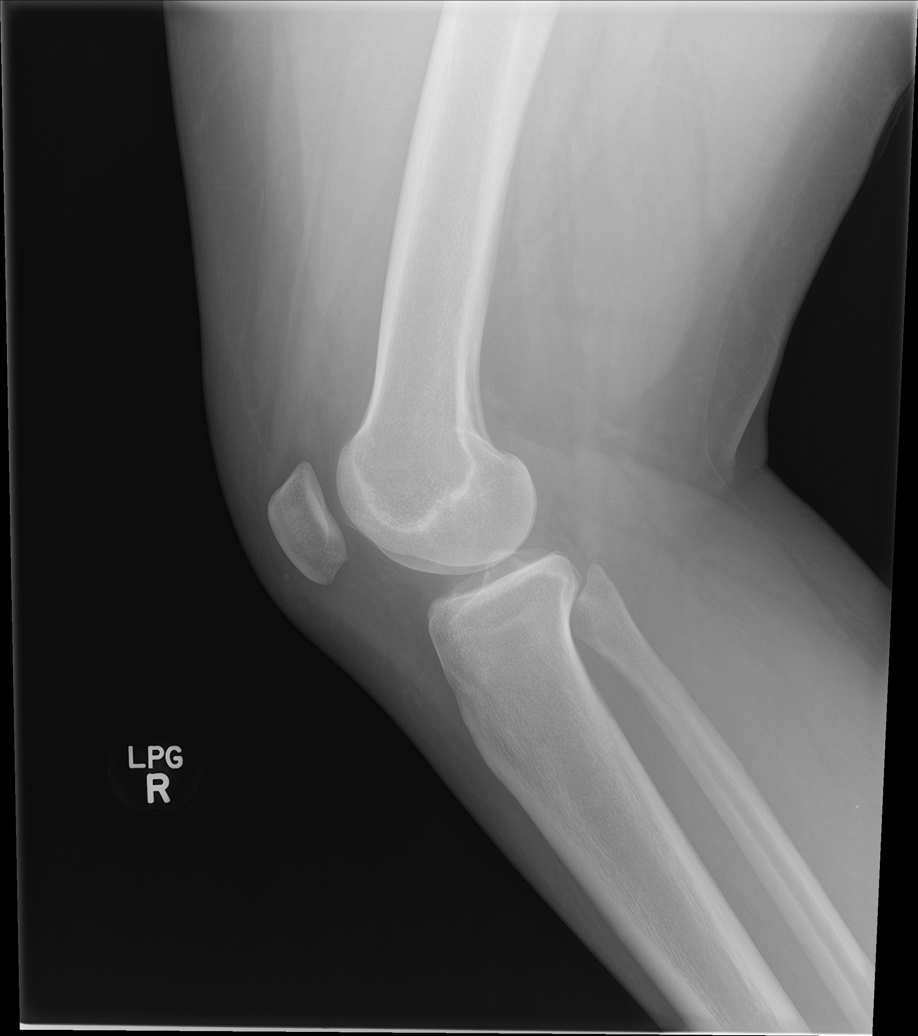

[4 of 4 positions shown; findings below may reference images not displayed]

FINDINGS: No fracture of the proximal tibia or distal femur. Patella is
normal. No joint effusion.
IMPRESSION: No fracture or dislocation.

## 2023-09-07 ENCOUNTER — Ambulatory Visit (HOSPITAL_COMMUNITY): Payer: No Payment, Other | Admitting: Mental Health

## 2023-09-07 DIAGNOSIS — F332 Major depressive disorder, recurrent severe without psychotic features: Secondary | ICD-10-CM

## 2023-09-07 DIAGNOSIS — F411 Generalized anxiety disorder: Secondary | ICD-10-CM | POA: Diagnosis not present
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
## 2023-11-02 ENCOUNTER — Ambulatory Visit (INDEPENDENT_AMBULATORY_CARE_PROVIDER_SITE_OTHER): Admitting: Mental Health

## 2023-11-02 DIAGNOSIS — F332 Major depressive disorder, recurrent severe without psychotic features: Secondary | ICD-10-CM

## 2023-11-02 DIAGNOSIS — F411 Generalized anxiety disorder: Secondary | ICD-10-CM | POA: Diagnosis not present

## 2023-11-02 NOTE — Progress Notes (Unsigned)
 THERAPIST PROGRESS NOTE Virtual Visit via Video Note  I connected with Lalonnie C Schrieber on 11/02/2023 at  9:00 AM EDT by a video enabled telemedicine application and verified that I am speaking with the correct person using two identifiers.  Location: Patient: work   Provider:    I discussed the limitations of evaluation and management by telemedicine and the availability of in person appointments. The patient expressed understanding and agreed to proceed.   I discussed the assessment and treatment plan with the patient. The patient was provided an opportunity to ask questions and all were answered. The patient agreed with the plan and demonstrated an understanding of the instructions.   The patient was advised to call back or seek an in-person evaluation if the symptoms worsen or if the condition fails to improve as anticipated.  I provided 37 minutes of non-face-to-face time during this encounter.   Loman Risk, [facility]   Session Time: 9:06   Participation Level: Active  Behavioral Response: CasualAlertDysphoric  Type of Therapy: Individual Therapy  Treatment Goals addressed: STG: Willena will increase management of moods (depression/anger) AEB development of x 3 effective emotional regulation and distress tolerance skills with increased ability to manage stressful events per self report within the next 90 days    STG: Darrielle will increase feelings of self-worth/esteem AEB engaging in self-esteem building exercises weekly within the next 90 days  ProgressTowards Goals: Progressing  Interventions: CBT and Supportive  Summary:   EMIE POTEAT is a 23 y.o. female who presents with dx of major depression and generalized anxiety. Presents for follow up for engagement in outpatient therapy . Shares for moods to have been more stable but continues to have periods of low mood. Reports current stressors with not doing well in some school courses with working full  time as well as in school full time plus being a single parent. Shares has decided to go to school part time to accommodate for her various roles. Notes to continue to have a better relationship with her mother and visits on Sundays. Shares thoughts on navigating relationship with son's father. Explores with therapist working to manage various roles and keeping balance in daily life. Shares ongoing work on self-esteem and has been able to interact with friend and tend to her faith. Notes maladaptive thoughts of " I am failing Janelle Mediate." Thoughts of not doing enough. Shares will work on presenting for walk in medication management as previous medication regiment was beneficial. Denies safety concerns.   Suicidal/Homicidal: Nowithout intent/plan  Therapist Response: Therapist engaged Caryn in tele-therapy session, completed check in and assessed for current location and confidentiality of session. Assessed for current level of functioning, sxs management and level of stressors. Provided support and encouragement; validated feelings. Provided safe space to shares thoughts and feelings in regards to role strain. Explored adjustments needed to support meeting personal goals. Supported in reframing distorted thoughts in regard to self. Encouraged presenting to medication management walk in. Reviewed session and provided follow up   Plan: Return again in  x 7 weeks.  Diagnosis: Generalized anxiety disorder  MDD (major depressive disorder), recurrent severe, without psychosis (HCC)  Collaboration of Care: Medication Management AEB Encouraged to present for walk in medication management appointment   Patient/Guardian was advised Release of Information must be obtained prior to any record release in order to collaborate their care with an outside provider. Patient/Guardian was advised if they have not already done so to contact the registration department to sign all  necessary forms in order for us  to release  information regarding their care.   Consent: Patient/Guardian gives verbal consent for treatment and assignment of benefits for services provided during this visit. Patient/Guardian expressed understanding and agreed to proceed.   Carmel Chimes [place], [facility] 11/02/2023

## 2023-12-31 ENCOUNTER — Ambulatory Visit (INDEPENDENT_AMBULATORY_CARE_PROVIDER_SITE_OTHER): Admitting: Mental Health

## 2023-12-31 DIAGNOSIS — F332 Major depressive disorder, recurrent severe without psychotic features: Secondary | ICD-10-CM | POA: Diagnosis not present

## 2023-12-31 DIAGNOSIS — F411 Generalized anxiety disorder: Secondary | ICD-10-CM

## 2023-12-31 NOTE — Progress Notes (Signed)
   THERAPIST PROGRESS NOTE Virtual Visit via Video Note  I connected with Heidi [name] on 12/31/23 at  9:00 AM EDT by a video enabled telemedicine application and verified that I am speaking with the correct person using two identifiers.  Location: Patient: work Provider: home office   I discussed the limitations of evaluation and management by telemedicine and the availability of in person appointments. The patient expressed understanding and agreed to proceed.  I discussed the assessment and treatment plan with the patient. The patient was provided an opportunity to ask questions and all were answered. The patient agreed with the plan and demonstrated an understanding of the instructions.   The patient was advised to call back or seek an in-person evaluation if the symptoms worsen or if the condition fails to improve as anticipated.  I provided 20 minutes of non-face-to-face time during this encounter.   Ty Bernice [place], [facility]   Session Time: 9:03  am (20 minutes)  Participation Level: Active  Behavioral Response: CasualAlertWNL  Type of Therapy: Individual Therapy  Treatment Goals addressed: STG: Heidi [name] will increase management of moods (depression/anger) AEB development of x 3 effective emotional regulation and distress tolerance skills with increased ability to manage stressful events per self report within the next 90 days    STG: Heidi [name] will increase feelings of self-worth/esteem AEB engaging in self-esteem building exercises weekly within the next 90 days  ProgressTowards Goals: Progressing  Interventions: Supportive  Summary:  Heidi [name] is a 23 y.o. female who presents with dx of major depression and generalized anxiety. Presents for follow up for engagement in outpatient therapy . Shares for moods to have been  a little bit depressed. Shares events in which lead to be being let go from position and now working new position. Shares for family  and friendships relations to be going well and has starting dating a new gentleman. Shares desire to re-engage in school part time and wanting to explore what she wants for her self. Shares some improvement in self-esteem with declining to worry of what others think of her. Shares desire to restart medications and presenting for walk in. Denies safety concerns. Ongoing work towards goals.   Suicidal/Homicidal: Nowithout intent/plan  Therapist Response: Therapist engaged Heidi [name] in tele-therapy session, completed check in and assessed for current location and confidentiality of session. Assessed for current level of functioning, sxs management and level of stressors. Provided support and encouragement; validated feelings. Provided safe space to shares thoughts and feelings in regards recent changes with employment and current financial stressors. Reviewed balance in life, behavioral activation and engagement in enjoyable activities. Encouraged ongoing work towards self-esteem with being mindful of internal dialogue of self and engaging in life with choices and behaviors that align with morals and values. Reviewed session and provided follow up. Plan: Return again in x7 weeks.  Diagnosis: MDD (major depressive disorder), recurrent severe, without psychosis (HCC)  Generalized anxiety disorder  Collaboration of Care: Other None  Patient/Guardian was advised Release of Information must be obtained prior to any record release in order to collaborate their care with an outside provider. Patient/Guardian was advised if they have not already done so to contact the registration department to sign all necessary forms in order for us  to release information regarding their care.   Consent: Patient/Guardian gives verbal consent for treatment and assignment of benefits for services provided during this visit. Patient/Guardian expressed understanding and agreed to proceed.   Ty Bernice [place],  [facility] 12/31/2023

## 2024-02-26 ENCOUNTER — Encounter (HOSPITAL_COMMUNITY): Payer: Self-pay

## 2024-02-26 ENCOUNTER — Telehealth (HOSPITAL_COMMUNITY): Payer: Self-pay | Admitting: Mental Health

## 2024-02-26 ENCOUNTER — Ambulatory Visit (HOSPITAL_COMMUNITY): Admitting: Mental Health

## 2024-02-26 NOTE — Telephone Encounter (Signed)
 Pt. Had tele-therapy appointment scheduled 9/2 @ 9am. Unable to leave voicemail- vm did not pick up. Therapist sent link for tele-therapy x 2 with no response.

## 2024-03-27 ENCOUNTER — Ambulatory Visit
Admission: EM | Admit: 2024-03-27 | Discharge: 2024-03-27 | Disposition: A | Discharge status: Home / Self Care | Payer: Self-pay | Attending: Student | Admitting: Student

## 2024-03-27 ENCOUNTER — Encounter: Payer: Self-pay | Admitting: Emergency Medicine

## 2024-03-27 DIAGNOSIS — R399 Unspecified symptoms and signs involving the genitourinary system: Secondary | ICD-10-CM

## 2024-03-27 DIAGNOSIS — Z113 Encounter for screening for infections with a predominantly sexual mode of transmission: Secondary | ICD-10-CM

## 2024-03-27 LAB — POCT URINE DIPSTICK
Bilirubin, UA: NEGATIVE
Glucose, UA: NEGATIVE mg/dL
Ketones, POC UA: NEGATIVE mg/dL
Leukocytes, UA: NEGATIVE
Nitrite, UA: NEGATIVE
POC PROTEIN,UA: NEGATIVE
Spec Grav, UA: >=1.03 — AB (ref 1.010–1.025)
Urobilinogen, UA: 0.2 U/dL
pH, UA: 6 (ref 5.0–8.0)

## 2024-03-27 LAB — POCT URINE PREGNANCY: Preg Test, Ur: NEGATIVE

## 2024-03-27 NOTE — ED Triage Notes (Signed)
 Pt c/o lower abdominal pain/ squeezing feeling for about 2 days. Denies any burning with urination.

## 2024-03-27 NOTE — ED Provider Notes (Signed)
 [facility]    CSN: [id number] Arrival date & time: 03/27/24  1635      History   Chief Complaint Chief Complaint  Patient presents with   Abdominal Pain    HPI Heidi [name] is a 23 y.o. female presenting with suprapubic abdominal pain and squeezing feeling for about 2 days.  She does have a new partner in the last month, but they used condoms.  Denies history of UTI.  Denies vaginal symptoms, including discharge, dysuria, frequency, odor.  Denies back pain.  Denies change in bowel function.  HPI  Past Medical History:  Diagnosis Date   Anxiety    Depression    GERD (gastroesophageal reflux disease)    Hyperlipidemia    Insomnia    Medical history non-contributory    Obesity    Prediabetes    Vitamin D  deficiency     Patient Active Problem List   Diagnosis Date Noted   Breast pain 03/23/2023   Hyperlipidemia 09/19/2022   Prediabetes 09/19/2022   Vitamin D  deficiency 09/19/2022   Class 2 severe obesity with serious comorbidity in adult 08/15/2022   Annual physical exam 08/15/2022   Need for influenza vaccination 08/15/2022   Insomnia due to other mental disorder 07/27/2022   Gastroesophageal reflux disease 07/27/2022   MDD (major depressive disorder), recurrent severe, without psychosis (HCC) 06/23/2022   Generalized anxiety disorder 06/23/2022   Family discord 01/24/2021   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   Suicidal ideations    Cesarean delivery delivered    Iron deficiency anemia during pregnancy 10/20/2019   Alpha thalassemia silent carrier 08/27/2019   Thrombocytopenia 08/02/2019   Obesity (BMI 30-39.9) 06/30/2019    Past Surgical History:  Procedure Laterality Date   CESAREAN SECTION N/A 10/21/2019   Procedure: CESAREAN SECTION;  Surgeon: Barbra Lang PARAS, DO;  Location: [facility] OR[facility];  Service: Obstetrics;  Laterality: N/A;   NO PAST SURGERIES      OB History     Gravida  2   Para  1    Term  1   Preterm      AB      Living  1      SAB      IAB      Ectopic      Multiple  0   Live Births  1            Home Medications    Prior to Admission medications   Medication Sig Start Date End Date Taking? Authorizing Provider  pantoprazole  (PROTONIX ) 20 MG tablet Take 1 tablet (20 mg total) by mouth daily. 07/27/22 03/23/23  Nwoko, Uchenna E, PA  sertraline  (ZOLOFT ) 100 MG tablet Take 1 tablet (100 mg total) by mouth daily. 07/27/22 07/27/23  Nwoko, Uchenna E, PA  traZODone  (DESYREL ) 100 MG tablet Take 1 tablet (100 mg total) by mouth at bedtime. 07/27/22 07/27/23  Nwoko, Uchenna E, PA  Vitamin D , Ergocalciferol , (DRISDOL ) 1.25 MG (50000 UNIT) CAPS capsule TAKE 1 CAPSULE BY MOUTH EVERY 7 DAYS Patient not taking: Reported on 11/21/2022 10/24/22   Paseda, Folashade R, FNP    Family History Family History  Problem Relation Age of Onset   Diabetes Mother    Hypertension Mother    Stroke Neg Hx    Cancer Neg Hx     Social History Social History   Tobacco Use   Smoking status: Never   Smokeless tobacco: Never  Vaping Use  Vaping status: Never Used  Substance Use Topics   Alcohol use: No   Drug use: No     Allergies   Strawberry extract   Review of Systems Review of Systems  Constitutional:  Negative for chills and fever.  HENT:  Negative for sore throat.   Eyes:  Negative for pain and redness.  Respiratory:  Negative for shortness of breath.   Cardiovascular:  Negative for chest pain.  Gastrointestinal:  Positive for abdominal pain. Negative for diarrhea, nausea and vomiting.  Genitourinary:  Negative for decreased urine volume, difficulty urinating, dysuria, flank pain, frequency, genital sores, hematuria and urgency.  Musculoskeletal:  Negative for back pain.  Skin:  Negative for rash.     Physical Exam Triage Vital Signs ED Triage Vitals  Encounter Vitals Group     BP 03/27/24 1642 117/73     Girls Systolic BP Percentile --      Girls  Diastolic BP Percentile --      Boys Systolic BP Percentile --      Boys Diastolic BP Percentile --      Pulse Rate 03/27/24 1642 83     Resp 03/27/24 1642 17     Temp 03/27/24 1642 99.6 F (37.6 C)     Temp Source 03/27/24 1642 Oral     SpO2 03/27/24 1642 94 %     Weight --      Height --      Head Circumference --      Peak Flow --      Pain Score 03/27/24 1645 5     Pain Loc --      Pain Education --      Exclude from Growth Chart --    No data found.  Updated Vital Signs BP 117/73 (BP Location: Right Arm)   Pulse 83   Temp 99.6 F (37.6 C) (Oral)   Resp 17   SpO2 94%   Breastfeeding No   Visual Acuity Right Eye Distance:   Left Eye Distance:   Bilateral Distance:    Right Eye Near:   Left Eye Near:    Bilateral Near:     Physical Exam Vitals reviewed.  Constitutional:      General: She is not in acute distress.    Appearance: Normal appearance. She is not ill-appearing.  HENT:     Head: Normocephalic and atraumatic.     Mouth/Throat:     Mouth: Mucous membranes are moist.     Comments: Moist mucous membranes Eyes:     Extraocular Movements: Extraocular movements intact.     Pupils: Pupils are equal, round, and reactive to light.  Cardiovascular:     Rate and Rhythm: Normal rate and regular rhythm.     Heart sounds: Normal heart sounds.  Pulmonary:     Effort: Pulmonary effort is normal.     Breath sounds: Normal breath sounds. No wheezing, rhonchi or rales.  Abdominal:     General: Bowel sounds are normal. There is no distension.     Palpations: Abdomen is soft. There is no mass.     Tenderness: There is abdominal tenderness in the suprapubic area. There is no right CVA tenderness, left CVA tenderness, guarding or rebound. Negative signs include Murphy's sign and Rovsing's sign.     Comments: No guarding, no rebound  Skin:    General: Skin is warm.     Capillary Refill: Capillary refill takes less than 2 seconds.     Comments: Good skin  turgor   Neurological:     General: No focal deficit present.     Mental Status: She is alert and oriented to person, place, and time.  Psychiatric:        Mood and Affect: Mood normal.        Behavior: Behavior normal.      UC Treatments / Results  Labs (all labs ordered are listed, but only abnormal results are displayed) Labs Reviewed  POCT URINE DIPSTICK - Abnormal; Notable for the following components:      Result Value   Spec Grav, UA >=1.030 (*)    Blood, UA trace-intact (*)    All other components within normal limits  URINE CULTURE  POCT URINE PREGNANCY  CERVICOVAGINAL ANCILLARY ONLY    EKG   Radiology No results found.  Procedures Procedures (including critical care time)  Medications Ordered in UC Medications - No data to display  Initial Impression / Assessment and Plan / UC Course  I have reviewed the triage vital signs and the nursing notes.  Pertinent labs & imaging results that were available during my care of the patient were reviewed by me and considered in my medical decision making (see chart for details).     Urinary symptoms UA with trace blood, negative leuk, negative nitrite.  Culture sent.  Checking for BV, yeast, gonorrhea, chlamydia, trichomonas.  Safe sex precautions. Patient with Nexplanon  for contraception.  She had a miscarriage 1 year ago.  Final Clinical Impressions(s) / UC Diagnoses   Final diagnoses:  Urinary symptom or sign  Routine screening for STI (sexually transmitted infection)     Discharge Instructions      -We are checking for bacteria in your urine, and sexually transmitted infections. Results will come back in about 3 days. We call with abnormal results. You will also see results on your mychart.     ED Prescriptions   None    PDMP not reviewed this encounter.   Arlyss Leita BRAVO, PA-C 03/27/24 [phone]

## 2024-03-27 NOTE — Discharge Instructions (Addendum)
-  We are checking for bacteria in your urine, and sexually transmitted infections. Results will come back in about 3 days. We call with abnormal results. You will also see results on your mychart.

## 2024-03-28 ENCOUNTER — Ambulatory Visit (HOSPITAL_COMMUNITY): Payer: Self-pay

## 2024-03-28 LAB — CERVICOVAGINAL ANCILLARY ONLY
Bacterial Vaginitis (gardnerella): POSITIVE — AB
Candida Glabrata: NEGATIVE
Candida Vaginitis: POSITIVE — AB
Chlamydia: NEGATIVE
Comment: NEGATIVE
Comment: NEGATIVE
Comment: NEGATIVE
Comment: NEGATIVE
Comment: NEGATIVE
Comment: NORMAL
Neisseria Gonorrhea: NEGATIVE
Trichomonas: NEGATIVE

## 2024-03-28 LAB — URINE CULTURE: Culture: NO GROWTH

## 2024-03-28 MED ORDER — METRONIDAZOLE 500 MG PO TABS: 500.0000 mg | ORAL_TABLET | Freq: Two times a day (BID) | ORAL | 0 refills | Status: AC

## 2024-03-28 MED ORDER — FLUCONAZOLE 150 MG PO TABS: 150.0000 mg | ORAL_TABLET | Freq: Once | ORAL | 0 refills | Status: AC
